# Patient Record
Sex: Male | Born: 1935 | Race: White | Hispanic: No | Marital: Married
Health system: Southern US, Community
[De-identification: ages and names within clinical notes are randomized; demographics above are authoritative.]

---

## 1998-07-30 ENCOUNTER — Ambulatory Visit (HOSPITAL_COMMUNITY): Admission: RE | Admit: 1998-07-30 | Discharge: 1998-07-30 | Payer: Self-pay | Admitting: Urology

## 2000-01-06 ENCOUNTER — Ambulatory Visit (HOSPITAL_COMMUNITY): Admission: RE | Admit: 2000-01-06 | Discharge: 2000-01-06 | Payer: Self-pay | Admitting: Urology

## 2000-01-06 ENCOUNTER — Encounter (INDEPENDENT_AMBULATORY_CARE_PROVIDER_SITE_OTHER): Payer: Self-pay

## 2000-08-03 ENCOUNTER — Ambulatory Visit (HOSPITAL_COMMUNITY): Admission: RE | Admit: 2000-08-03 | Discharge: 2000-08-03 | Payer: Self-pay | Admitting: Urology

## 2000-12-11 ENCOUNTER — Encounter: Payer: Self-pay | Admitting: Urology

## 2000-12-11 ENCOUNTER — Encounter: Admission: RE | Admit: 2000-12-11 | Discharge: 2000-12-11 | Payer: Self-pay | Admitting: Urology

## 2001-01-11 ENCOUNTER — Ambulatory Visit (HOSPITAL_COMMUNITY): Admission: RE | Admit: 2001-01-11 | Discharge: 2001-01-11 | Payer: Self-pay | Admitting: Urology

## 2001-01-11 ENCOUNTER — Encounter (INDEPENDENT_AMBULATORY_CARE_PROVIDER_SITE_OTHER): Payer: Self-pay

## 2001-03-04 ENCOUNTER — Ambulatory Visit (HOSPITAL_COMMUNITY): Admission: RE | Admit: 2001-03-04 | Discharge: 2001-03-04 | Payer: Self-pay | Admitting: Internal Medicine

## 2002-05-25 ENCOUNTER — Encounter: Payer: Self-pay | Admitting: Urology

## 2002-05-25 ENCOUNTER — Encounter: Admission: RE | Admit: 2002-05-25 | Discharge: 2002-05-25 | Payer: Self-pay | Admitting: Urology

## 2002-09-21 ENCOUNTER — Ambulatory Visit (HOSPITAL_COMMUNITY): Admission: RE | Admit: 2002-09-21 | Discharge: 2002-09-21 | Payer: Self-pay | Admitting: Gastroenterology

## 2002-09-21 ENCOUNTER — Encounter (INDEPENDENT_AMBULATORY_CARE_PROVIDER_SITE_OTHER): Payer: Self-pay

## 2006-05-06 ENCOUNTER — Ambulatory Visit: Payer: Self-pay | Admitting: Pulmonary Disease

## 2006-06-10 ENCOUNTER — Ambulatory Visit: Payer: Self-pay | Admitting: Pulmonary Disease

## 2006-09-15 ENCOUNTER — Ambulatory Visit: Payer: Self-pay | Admitting: Pulmonary Disease

## 2007-11-19 ENCOUNTER — Inpatient Hospital Stay (HOSPITAL_COMMUNITY): Admission: EM | Admit: 2007-11-19 | Discharge: 2007-11-23 | Payer: Self-pay | Admitting: Emergency Medicine

## 2007-11-21 ENCOUNTER — Ambulatory Visit: Payer: Self-pay | Admitting: Infectious Diseases

## 2008-04-14 ENCOUNTER — Ambulatory Visit: Payer: Self-pay | Admitting: Oncology

## 2008-05-03 LAB — CBC WITH DIFFERENTIAL/PLATELET
Basophils Absolute: 0 10*3/uL (ref 0.0–0.1)
EOS%: 0.9 % (ref 0.0–7.0)
Eosinophils Absolute: 0.1 10*3/uL (ref 0.0–0.5)
HGB: 17.8 g/dL — ABNORMAL HIGH (ref 13.0–17.1)
LYMPH%: 23.8 % (ref 14.0–48.0)
MCH: 30.4 pg (ref 28.0–33.4)
MCV: 90.5 fL (ref 81.6–98.0)
MONO%: 9.5 % (ref 0.0–13.0)
NEUT%: 65.5 % (ref 40.0–75.0)
Platelets: 673 10*3/uL — ABNORMAL HIGH (ref 145–400)
RDW: 14.4 % (ref 11.2–14.6)

## 2008-05-09 LAB — COMPREHENSIVE METABOLIC PANEL
Alkaline Phosphatase: 47 U/L (ref 39–117)
BUN: 22 mg/dL (ref 6–23)
CO2: 26 mEq/L (ref 19–32)
Creatinine, Ser: 1.41 mg/dL (ref 0.40–1.50)
Glucose, Bld: 104 mg/dL — ABNORMAL HIGH (ref 70–99)
Total Bilirubin: 1.3 mg/dL — ABNORMAL HIGH (ref 0.3–1.2)

## 2008-05-09 LAB — LACTATE DEHYDROGENASE: LDH: 205 U/L (ref 94–250)

## 2008-05-09 LAB — JAK2 GENOTYPR

## 2008-08-01 ENCOUNTER — Ambulatory Visit: Payer: Self-pay | Admitting: Oncology

## 2008-11-01 ENCOUNTER — Ambulatory Visit: Payer: Self-pay | Admitting: Oncology

## 2008-11-01 LAB — COMPREHENSIVE METABOLIC PANEL
AST: 20 U/L (ref 0–37)
Albumin: 3.7 g/dL (ref 3.5–5.2)
Alkaline Phosphatase: 62 U/L (ref 39–117)
BUN: 18 mg/dL (ref 6–23)
Potassium: 4.7 mEq/L (ref 3.5–5.3)
Sodium: 138 mEq/L (ref 135–145)
Total Bilirubin: 1 mg/dL (ref 0.3–1.2)

## 2008-11-01 LAB — CBC WITH DIFFERENTIAL/PLATELET
Basophils Absolute: 0 10*3/uL (ref 0.0–0.1)
EOS%: 1.3 % (ref 0.0–7.0)
LYMPH%: 23.7 % (ref 14.0–49.0)
MCH: 30.1 pg (ref 27.2–33.4)
MCV: 91.6 fL (ref 79.3–98.0)
MONO%: 11.1 % (ref 0.0–14.0)
RBC: 5.3 10*6/uL (ref 4.20–5.82)
RDW: 15.6 % — ABNORMAL HIGH (ref 11.0–14.6)

## 2008-11-06 ENCOUNTER — Ambulatory Visit (HOSPITAL_COMMUNITY): Admission: RE | Admit: 2008-11-06 | Discharge: 2008-11-06 | Payer: Self-pay | Admitting: Urology

## 2008-11-16 ENCOUNTER — Ambulatory Visit (HOSPITAL_COMMUNITY): Admission: RE | Admit: 2008-11-16 | Discharge: 2008-11-16 | Payer: Self-pay | Admitting: Oncology

## 2008-11-23 LAB — CBC WITH DIFFERENTIAL/PLATELET
EOS%: 0.7 % (ref 0.0–7.0)
Eosinophils Absolute: 0.1 10*3/uL (ref 0.0–0.5)
LYMPH%: 18.3 % (ref 14.0–49.0)
MCH: 30.4 pg (ref 27.2–33.4)
MCHC: 33.4 g/dL (ref 32.0–36.0)
MCV: 91 fL (ref 79.3–98.0)
MONO%: 10 % (ref 0.0–14.0)
NEUT#: 6.4 10*3/uL (ref 1.5–6.5)
Platelets: 785 10*3/uL — ABNORMAL HIGH (ref 140–400)
RBC: 5.43 10*6/uL (ref 4.20–5.82)
RDW: 15.1 % — ABNORMAL HIGH (ref 11.0–14.6)

## 2008-11-23 LAB — COMPREHENSIVE METABOLIC PANEL
AST: 14 U/L (ref 0–37)
Alkaline Phosphatase: 55 U/L (ref 39–117)
Glucose, Bld: 100 mg/dL — ABNORMAL HIGH (ref 70–99)
Potassium: 5.2 mEq/L (ref 3.5–5.3)
Sodium: 138 mEq/L (ref 135–145)
Total Bilirubin: 0.7 mg/dL (ref 0.3–1.2)
Total Protein: 7 g/dL (ref 6.0–8.3)

## 2008-11-28 ENCOUNTER — Ambulatory Visit (HOSPITAL_COMMUNITY): Admission: RE | Admit: 2008-11-28 | Discharge: 2008-11-28 | Payer: Self-pay | Admitting: Oncology

## 2008-12-01 ENCOUNTER — Ambulatory Visit (HOSPITAL_COMMUNITY): Admission: RE | Admit: 2008-12-01 | Discharge: 2008-12-01 | Payer: Self-pay | Admitting: Urology

## 2008-12-01 ENCOUNTER — Encounter (INDEPENDENT_AMBULATORY_CARE_PROVIDER_SITE_OTHER): Payer: Self-pay | Admitting: Urology

## 2008-12-05 ENCOUNTER — Encounter: Payer: Self-pay | Admitting: Oncology

## 2008-12-05 ENCOUNTER — Ambulatory Visit (HOSPITAL_COMMUNITY): Admission: RE | Admit: 2008-12-05 | Discharge: 2008-12-05 | Payer: Self-pay | Admitting: Oncology

## 2008-12-22 ENCOUNTER — Ambulatory Visit: Payer: Self-pay | Admitting: Oncology

## 2008-12-26 LAB — CBC WITH DIFFERENTIAL/PLATELET
BASO%: 0.3 % (ref 0.0–2.0)
LYMPH%: 17.9 % (ref 14.0–49.0)
MCHC: 33.6 g/dL (ref 32.0–36.0)
MONO#: 0.8 10*3/uL (ref 0.1–0.9)
MONO%: 8.3 % (ref 0.0–14.0)
Platelets: 706 10*3/uL — ABNORMAL HIGH (ref 140–400)
RBC: 5.44 10*6/uL (ref 4.20–5.82)
RDW: 15.7 % — ABNORMAL HIGH (ref 11.0–14.6)
WBC: 9.8 10*3/uL (ref 4.0–10.3)

## 2008-12-26 LAB — COMPREHENSIVE METABOLIC PANEL
ALT: 9 U/L (ref 0–53)
Albumin: 4.2 g/dL (ref 3.5–5.2)
Alkaline Phosphatase: 62 U/L (ref 39–117)
CO2: 26 mEq/L (ref 19–32)
Glucose, Bld: 93 mg/dL (ref 70–99)
Potassium: 5.3 mEq/L (ref 3.5–5.3)
Sodium: 139 mEq/L (ref 135–145)
Total Bilirubin: 1.5 mg/dL — ABNORMAL HIGH (ref 0.3–1.2)
Total Protein: 7.4 g/dL (ref 6.0–8.3)

## 2009-01-09 LAB — CBC WITH DIFFERENTIAL/PLATELET
BASO%: 0.3 % (ref 0.0–2.0)
EOS%: 4.7 % (ref 0.0–7.0)
Eosinophils Absolute: 0.5 10*3/uL (ref 0.0–0.5)
LYMPH%: 18.8 % (ref 14.0–49.0)
MCHC: 33.1 g/dL (ref 32.0–36.0)
MCV: 90.6 fL (ref 79.3–98.0)
MONO%: 8.7 % (ref 0.0–14.0)
NEUT#: 7.1 10*3/uL — ABNORMAL HIGH (ref 1.5–6.5)
Platelets: 759 10*3/uL — ABNORMAL HIGH (ref 140–400)
RBC: 5.39 10*6/uL (ref 4.20–5.82)
RDW: 16.3 % — ABNORMAL HIGH (ref 11.0–14.6)

## 2009-01-09 LAB — COMPREHENSIVE METABOLIC PANEL
ALT: 14 U/L (ref 0–53)
AST: 21 U/L (ref 0–37)
Albumin: 3.9 g/dL (ref 3.5–5.2)
Alkaline Phosphatase: 62 U/L (ref 39–117)
Potassium: 4.4 mEq/L (ref 3.5–5.3)
Sodium: 138 mEq/L (ref 135–145)
Total Bilirubin: 1.6 mg/dL — ABNORMAL HIGH (ref 0.3–1.2)
Total Protein: 6.7 g/dL (ref 6.0–8.3)

## 2009-02-01 ENCOUNTER — Ambulatory Visit: Payer: Self-pay | Admitting: Oncology

## 2009-02-06 LAB — CBC WITH DIFFERENTIAL/PLATELET
BASO%: 0.4 % (ref 0.0–2.0)
Eosinophils Absolute: 0.1 10*3/uL (ref 0.0–0.5)
LYMPH%: 19.6 % (ref 14.0–49.0)
MCHC: 33.1 g/dL (ref 32.0–36.0)
MONO#: 1 10*3/uL — ABNORMAL HIGH (ref 0.1–0.9)
NEUT#: 7.2 10*3/uL — ABNORMAL HIGH (ref 1.5–6.5)
RBC: 5.54 10*6/uL (ref 4.20–5.82)
RDW: 17.2 % — ABNORMAL HIGH (ref 11.0–14.6)
WBC: 10.5 10*3/uL — ABNORMAL HIGH (ref 4.0–10.3)
lymph#: 2.1 10*3/uL (ref 0.9–3.3)

## 2009-02-06 LAB — COMPREHENSIVE METABOLIC PANEL
ALT: 23 U/L (ref 0–53)
Albumin: 3.6 g/dL (ref 3.5–5.2)
CO2: 29 mEq/L (ref 19–32)
Chloride: 101 mEq/L (ref 96–112)
Glucose, Bld: 93 mg/dL (ref 70–99)
Potassium: 3.8 mEq/L (ref 3.5–5.3)
Sodium: 137 mEq/L (ref 135–145)
Total Bilirubin: 2.2 mg/dL — ABNORMAL HIGH (ref 0.3–1.2)
Total Protein: 6.9 g/dL (ref 6.0–8.3)

## 2009-02-26 ENCOUNTER — Ambulatory Visit (HOSPITAL_COMMUNITY): Admission: RE | Admit: 2009-02-26 | Discharge: 2009-02-26 | Payer: Self-pay | Admitting: Oncology

## 2009-02-27 ENCOUNTER — Ambulatory Visit: Payer: Self-pay | Admitting: Oncology

## 2009-03-01 LAB — COMPREHENSIVE METABOLIC PANEL
ALT: 20 U/L (ref 0–53)
CO2: 30 mEq/L (ref 19–32)
Calcium: 9.5 mg/dL (ref 8.4–10.5)
Chloride: 104 mEq/L (ref 96–112)
Glucose, Bld: 86 mg/dL (ref 70–99)
Sodium: 140 mEq/L (ref 135–145)
Total Bilirubin: 1.4 mg/dL — ABNORMAL HIGH (ref 0.3–1.2)
Total Protein: 7.2 g/dL (ref 6.0–8.3)

## 2009-03-01 LAB — CBC WITH DIFFERENTIAL/PLATELET
BASO%: 0.4 % (ref 0.0–2.0)
Eosinophils Absolute: 0.2 10*3/uL (ref 0.0–0.5)
HCT: 48.7 % (ref 38.4–49.9)
LYMPH%: 21.5 % (ref 14.0–49.0)
MCHC: 33.1 g/dL (ref 32.0–36.0)
MONO#: 0.9 10*3/uL (ref 0.1–0.9)
NEUT#: 6.3 10*3/uL (ref 1.5–6.5)
NEUT%: 66.6 % (ref 39.0–75.0)
Platelets: 711 10*3/uL — ABNORMAL HIGH (ref 140–400)
RBC: 5.29 10*6/uL (ref 4.20–5.82)
WBC: 9.4 10*3/uL (ref 4.0–10.3)
lymph#: 2 10*3/uL (ref 0.9–3.3)

## 2009-03-07 ENCOUNTER — Ambulatory Visit (HOSPITAL_COMMUNITY): Admission: RE | Admit: 2009-03-07 | Discharge: 2009-03-07 | Payer: Self-pay | Admitting: Oncology

## 2009-03-15 LAB — CBC WITH DIFFERENTIAL/PLATELET
BASO%: 0.4 % (ref 0.0–2.0)
HCT: 47 % (ref 38.4–49.9)
MCHC: 33.8 g/dL (ref 32.0–36.0)
MONO#: 1.2 10*3/uL — ABNORMAL HIGH (ref 0.1–0.9)
RBC: 5.27 10*6/uL (ref 4.20–5.82)
RDW: 16 % — ABNORMAL HIGH (ref 11.0–14.6)
WBC: 10.4 10*3/uL — ABNORMAL HIGH (ref 4.0–10.3)
lymph#: 2.3 10*3/uL (ref 0.9–3.3)
nRBC: 0 % (ref 0–0)

## 2009-03-15 LAB — COMPREHENSIVE METABOLIC PANEL
ALT: 16 U/L (ref 0–53)
AST: 27 U/L (ref 0–37)
Albumin: 3.3 g/dL — ABNORMAL LOW (ref 3.5–5.2)
Calcium: 8.9 mg/dL (ref 8.4–10.5)
Chloride: 106 mEq/L (ref 96–112)
Potassium: 4.2 mEq/L (ref 3.5–5.3)
Sodium: 138 mEq/L (ref 135–145)

## 2009-03-22 LAB — CBC WITH DIFFERENTIAL/PLATELET
BASO%: 0.4 % (ref 0.0–2.0)
Basophils Absolute: 0 10*3/uL (ref 0.0–0.1)
HCT: 44.6 % (ref 38.4–49.9)
HGB: 15.3 g/dL (ref 13.0–17.1)
LYMPH%: 18.3 % (ref 14.0–49.0)
MCH: 30.6 pg (ref 27.2–33.4)
MCHC: 34.3 g/dL (ref 32.0–36.0)
MONO#: 1.2 10*3/uL — ABNORMAL HIGH (ref 0.1–0.9)
NEUT%: 68 % (ref 39.0–75.0)
Platelets: 614 10*3/uL — ABNORMAL HIGH (ref 140–400)
WBC: 10 10*3/uL (ref 4.0–10.3)

## 2009-03-22 LAB — COMPREHENSIVE METABOLIC PANEL
ALT: 17 U/L (ref 0–53)
AST: 24 U/L (ref 0–37)
BUN: 24 mg/dL — ABNORMAL HIGH (ref 6–23)
Creatinine, Ser: 1.13 mg/dL (ref 0.40–1.50)
Total Bilirubin: 0.9 mg/dL (ref 0.3–1.2)

## 2009-03-29 ENCOUNTER — Ambulatory Visit: Payer: Self-pay | Admitting: Oncology

## 2009-03-29 LAB — COMPREHENSIVE METABOLIC PANEL
BUN: 26 mg/dL — ABNORMAL HIGH (ref 6–23)
CO2: 27 mEq/L (ref 19–32)
Calcium: 8.9 mg/dL (ref 8.4–10.5)
Chloride: 104 mEq/L (ref 96–112)
Creatinine, Ser: 1.37 mg/dL (ref 0.40–1.50)

## 2009-03-29 LAB — CBC WITH DIFFERENTIAL/PLATELET
Basophils Absolute: 0 10*3/uL (ref 0.0–0.1)
HCT: 43 % (ref 38.4–49.9)
HGB: 14.4 g/dL (ref 13.0–17.1)
MONO#: 1.1 10*3/uL — ABNORMAL HIGH (ref 0.1–0.9)
NEUT%: 68.3 % (ref 39.0–75.0)
WBC: 9.7 10*3/uL (ref 4.0–10.3)
lymph#: 1.9 10*3/uL (ref 0.9–3.3)

## 2009-04-05 LAB — COMPREHENSIVE METABOLIC PANEL
ALT: 15 U/L (ref 0–53)
BUN: 24 mg/dL — ABNORMAL HIGH (ref 6–23)
CO2: 25 mEq/L (ref 19–32)
Calcium: 8.9 mg/dL (ref 8.4–10.5)
Chloride: 105 mEq/L (ref 96–112)
Creatinine, Ser: 1.41 mg/dL (ref 0.40–1.50)

## 2009-04-05 LAB — CBC WITH DIFFERENTIAL/PLATELET
BASO%: 0.3 % (ref 0.0–2.0)
HCT: 40.9 % (ref 38.4–49.9)
HGB: 13.8 g/dL (ref 13.0–17.1)
MCHC: 33.7 g/dL (ref 32.0–36.0)
MONO#: 1.3 10*3/uL — ABNORMAL HIGH (ref 0.1–0.9)
NEUT%: 62.3 % (ref 39.0–75.0)
WBC: 9.4 10*3/uL (ref 4.0–10.3)
lymph#: 2.2 10*3/uL (ref 0.9–3.3)

## 2009-04-12 LAB — COMPREHENSIVE METABOLIC PANEL
ALT: 13 U/L (ref 0–53)
Albumin: 3.4 g/dL — ABNORMAL LOW (ref 3.5–5.2)
CO2: 24 mEq/L (ref 19–32)
Chloride: 105 mEq/L (ref 96–112)
Glucose, Bld: 101 mg/dL — ABNORMAL HIGH (ref 70–99)
Potassium: 4.1 mEq/L (ref 3.5–5.3)
Sodium: 137 mEq/L (ref 135–145)
Total Protein: 6.4 g/dL (ref 6.0–8.3)

## 2009-04-12 LAB — CBC WITH DIFFERENTIAL/PLATELET
Basophils Absolute: 0 10*3/uL (ref 0.0–0.1)
Eosinophils Absolute: 0.1 10*3/uL (ref 0.0–0.5)
HGB: 13.3 g/dL (ref 13.0–17.1)
MCV: 91.1 fL (ref 79.3–98.0)
MONO#: 1 10*3/uL — ABNORMAL HIGH (ref 0.1–0.9)
NEUT#: 4.7 10*3/uL (ref 1.5–6.5)
RDW: 16.3 % — ABNORMAL HIGH (ref 11.0–14.6)
WBC: 7.8 10*3/uL (ref 4.0–10.3)
lymph#: 2.1 10*3/uL (ref 0.9–3.3)

## 2009-04-19 LAB — CBC WITH DIFFERENTIAL/PLATELET
BASO%: 0.2 % (ref 0.0–2.0)
EOS%: 0.3 % (ref 0.0–7.0)
HCT: 36.3 % — ABNORMAL LOW (ref 38.4–49.9)
LYMPH%: 27 % (ref 14.0–49.0)
MCH: 32 pg (ref 27.2–33.4)
MCHC: 34.1 g/dL (ref 32.0–36.0)
MCV: 93.8 fL (ref 79.3–98.0)
MONO#: 0.8 10*3/uL (ref 0.1–0.9)
MONO%: 12 % (ref 0.0–14.0)
NEUT%: 60.5 % (ref 39.0–75.0)
Platelets: 308 10*3/uL (ref 140–400)
RBC: 3.87 10*6/uL — ABNORMAL LOW (ref 4.20–5.82)

## 2009-04-19 LAB — PROTEIN, URINE, 24 HOUR
Collection Interval-UPROT: 24 hours
Protein, 24H Urine: 830 mg/d — ABNORMAL HIGH (ref 50–100)
Urine Total Volume-UPROT: 1050 mL

## 2009-04-19 LAB — COMPREHENSIVE METABOLIC PANEL
Albumin: 3.7 g/dL (ref 3.5–5.2)
Alkaline Phosphatase: 44 U/L (ref 39–117)
BUN: 22 mg/dL (ref 6–23)
CO2: 27 mEq/L (ref 19–32)
Glucose, Bld: 86 mg/dL (ref 70–99)
Total Bilirubin: 1.4 mg/dL — ABNORMAL HIGH (ref 0.3–1.2)

## 2009-04-26 LAB — CBC WITH DIFFERENTIAL/PLATELET
BASO%: 0.2 % (ref 0.0–2.0)
Eosinophils Absolute: 0 10*3/uL (ref 0.0–0.5)
MCHC: 33.8 g/dL (ref 32.0–36.0)
MCV: 92.5 fL (ref 79.3–98.0)
MONO#: 1.1 10*3/uL — ABNORMAL HIGH (ref 0.1–0.9)
MONO%: 13.3 % (ref 0.0–14.0)
NEUT#: 4.5 10*3/uL (ref 1.5–6.5)
NEUT%: 52.8 % (ref 39.0–75.0)
RBC: 4 10*6/uL — ABNORMAL LOW (ref 4.20–5.82)
RDW: 16.9 % — ABNORMAL HIGH (ref 11.0–14.6)
WBC: 8.5 10*3/uL (ref 4.0–10.3)
nRBC: 0 % (ref 0–0)

## 2009-05-01 ENCOUNTER — Ambulatory Visit: Payer: Self-pay | Admitting: Oncology

## 2009-05-03 LAB — COMPREHENSIVE METABOLIC PANEL
ALT: 13 U/L (ref 0–53)
CO2: 23 mEq/L (ref 19–32)
Calcium: 8.9 mg/dL (ref 8.4–10.5)
Chloride: 104 mEq/L (ref 96–112)
Creatinine, Ser: 1.17 mg/dL (ref 0.40–1.50)

## 2009-05-03 LAB — CBC WITH DIFFERENTIAL/PLATELET
Basophils Absolute: 0 10*3/uL (ref 0.0–0.1)
Eosinophils Absolute: 0 10*3/uL (ref 0.0–0.5)
HCT: 35.8 % — ABNORMAL LOW (ref 38.4–49.9)
LYMPH%: 37.2 % (ref 14.0–49.0)
MCV: 94.7 fL (ref 79.3–98.0)
MONO%: 11.3 % (ref 0.0–14.0)
Platelets: 190 10*3/uL (ref 140–400)
WBC: 6.5 10*3/uL (ref 4.0–10.3)

## 2009-05-22 ENCOUNTER — Ambulatory Visit (HOSPITAL_COMMUNITY): Admission: RE | Admit: 2009-05-22 | Discharge: 2009-05-22 | Payer: Self-pay | Admitting: Oncology

## 2009-05-25 LAB — CBC WITH DIFFERENTIAL/PLATELET
BASO%: 0.2 % (ref 0.0–2.0)
Basophils Absolute: 0 10*3/uL (ref 0.0–0.1)
HCT: 33.3 % — ABNORMAL LOW (ref 38.4–49.9)
HGB: 11.3 g/dL — ABNORMAL LOW (ref 13.0–17.1)
MONO#: 0.5 10*3/uL (ref 0.1–0.9)
NEUT#: 3.1 10*3/uL (ref 1.5–6.5)
NEUT%: 51.1 % (ref 39.0–75.0)
WBC: 6.1 10*3/uL (ref 4.0–10.3)
lymph#: 2.4 10*3/uL (ref 0.9–3.3)

## 2009-05-25 LAB — COMPREHENSIVE METABOLIC PANEL
ALT: 8 U/L (ref 0–53)
Albumin: 4.2 g/dL (ref 3.5–5.2)
BUN: 23 mg/dL (ref 6–23)
CO2: 21 mEq/L (ref 19–32)
Calcium: 8.6 mg/dL (ref 8.4–10.5)
Chloride: 107 mEq/L (ref 96–112)
Creatinine, Ser: 1.18 mg/dL (ref 0.40–1.50)
Potassium: 4.4 mEq/L (ref 3.5–5.3)

## 2009-05-28 ENCOUNTER — Ambulatory Visit: Payer: Self-pay | Admitting: Oncology

## 2009-05-30 LAB — COMPREHENSIVE METABOLIC PANEL
ALT: 8 U/L (ref 0–53)
AST: 13 U/L (ref 0–37)
Albumin: 4 g/dL (ref 3.5–5.2)
CO2: 21 mEq/L (ref 19–32)
Calcium: 8.2 mg/dL — ABNORMAL LOW (ref 8.4–10.5)
Chloride: 107 mEq/L (ref 96–112)
Creatinine, Ser: 1.32 mg/dL (ref 0.40–1.50)
Potassium: 4.4 mEq/L (ref 3.5–5.3)
Sodium: 140 mEq/L (ref 135–145)
Total Protein: 6 g/dL (ref 6.0–8.3)

## 2009-05-30 LAB — CBC WITH DIFFERENTIAL/PLATELET
BASO%: 0 % (ref 0.0–2.0)
EOS%: 0.2 % (ref 0.0–7.0)
HCT: 33.2 % — ABNORMAL LOW (ref 38.4–49.9)
MCHC: 33.4 g/dL (ref 32.0–36.0)
MONO#: 0.6 10*3/uL (ref 0.1–0.9)
NEUT%: 34.6 % — ABNORMAL LOW (ref 39.0–75.0)
RDW: 19.2 % — ABNORMAL HIGH (ref 11.0–14.6)
WBC: 4.5 10*3/uL (ref 4.0–10.3)
lymph#: 2.3 10*3/uL (ref 0.9–3.3)

## 2009-06-05 LAB — COMPREHENSIVE METABOLIC PANEL
ALT: 14 U/L (ref 0–53)
AST: 21 U/L (ref 0–37)
CO2: 27 mEq/L (ref 19–32)
Calcium: 8.9 mg/dL (ref 8.4–10.5)
Chloride: 103 mEq/L (ref 96–112)
Creatinine, Ser: 1.27 mg/dL (ref 0.40–1.50)
Sodium: 135 mEq/L (ref 135–145)
Total Protein: 6.4 g/dL (ref 6.0–8.3)

## 2009-06-05 LAB — CBC WITH DIFFERENTIAL/PLATELET
BASO%: 0.3 % (ref 0.0–2.0)
EOS%: 0.3 % (ref 0.0–7.0)
MCH: 35.9 pg — ABNORMAL HIGH (ref 27.2–33.4)
MCHC: 34.4 g/dL (ref 32.0–36.0)
MONO#: 0.4 10*3/uL (ref 0.1–0.9)
NEUT%: 38.2 % — ABNORMAL LOW (ref 39.0–75.0)
RBC: 2.89 10*6/uL — ABNORMAL LOW (ref 4.20–5.82)
RDW: 20.5 % — ABNORMAL HIGH (ref 11.0–14.6)
WBC: 4.6 10*3/uL (ref 4.0–10.3)
lymph#: 2.4 10*3/uL (ref 0.9–3.3)

## 2009-06-12 LAB — CBC WITH DIFFERENTIAL/PLATELET
BASO%: 0.3 % (ref 0.0–2.0)
EOS%: 0.1 % (ref 0.0–7.0)
HCT: 30.4 % — ABNORMAL LOW (ref 38.4–49.9)
LYMPH%: 50.7 % — ABNORMAL HIGH (ref 14.0–49.0)
MCH: 35.7 pg — ABNORMAL HIGH (ref 27.2–33.4)
MCHC: 34.2 g/dL (ref 32.0–36.0)
NEUT%: 34.2 % — ABNORMAL LOW (ref 39.0–75.0)
Platelets: 307 10*3/uL (ref 140–400)
RBC: 2.91 10*6/uL — ABNORMAL LOW (ref 4.20–5.82)
WBC: 4.4 10*3/uL (ref 4.0–10.3)

## 2009-06-12 LAB — COMPREHENSIVE METABOLIC PANEL
ALT: 12 U/L (ref 0–53)
AST: 16 U/L (ref 0–37)
Alkaline Phosphatase: 39 U/L (ref 39–117)
Creatinine, Ser: 1.24 mg/dL (ref 0.40–1.50)
Sodium: 138 mEq/L (ref 135–145)
Total Bilirubin: 1 mg/dL (ref 0.3–1.2)
Total Protein: 6.3 g/dL (ref 6.0–8.3)

## 2009-06-19 LAB — CBC WITH DIFFERENTIAL/PLATELET
BASO%: 0.2 % (ref 0.0–2.0)
EOS%: 0 % (ref 0.0–7.0)
HGB: 10.9 g/dL — ABNORMAL LOW (ref 13.0–17.1)
LYMPH%: 44 % (ref 14.0–49.0)
MCV: 105.4 fL — ABNORMAL HIGH (ref 79.3–98.0)
MONO#: 1.1 10*3/uL — ABNORMAL HIGH (ref 0.1–0.9)
NEUT#: 2.5 10*3/uL (ref 1.5–6.5)
NEUT%: 38.9 % — ABNORMAL LOW (ref 39.0–75.0)
RDW: 19.1 % — ABNORMAL HIGH (ref 11.0–14.6)
WBC: 6.3 10*3/uL (ref 4.0–10.3)

## 2009-06-19 LAB — COMPREHENSIVE METABOLIC PANEL
ALT: 10 U/L (ref 0–53)
AST: 16 U/L (ref 0–37)
Alkaline Phosphatase: 39 U/L (ref 39–117)
Creatinine, Ser: 1.36 mg/dL (ref 0.40–1.50)
Total Bilirubin: 0.9 mg/dL (ref 0.3–1.2)

## 2009-06-25 ENCOUNTER — Ambulatory Visit: Payer: Self-pay | Admitting: Oncology

## 2009-06-26 LAB — CBC WITH DIFFERENTIAL/PLATELET
BASO%: 0.3 % (ref 0.0–2.0)
EOS%: 0.1 % (ref 0.0–7.0)
HCT: 29.6 % — ABNORMAL LOW (ref 38.4–49.9)
LYMPH%: 52.8 % — ABNORMAL HIGH (ref 14.0–49.0)
MCH: 37.3 pg — ABNORMAL HIGH (ref 27.2–33.4)
MCHC: 34.6 g/dL (ref 32.0–36.0)
MCV: 107.7 fL — ABNORMAL HIGH (ref 79.3–98.0)
MONO#: 0.7 10*3/uL (ref 0.1–0.9)
MONO%: 14 % (ref 0.0–14.0)
NEUT%: 32.8 % — ABNORMAL LOW (ref 39.0–75.0)
Platelets: 207 10*3/uL (ref 140–400)

## 2009-06-26 LAB — COMPREHENSIVE METABOLIC PANEL
ALT: 9 U/L (ref 0–53)
CO2: 22 mEq/L (ref 19–32)
Creatinine, Ser: 1.29 mg/dL (ref 0.40–1.50)
Total Bilirubin: 1 mg/dL (ref 0.3–1.2)

## 2009-07-12 LAB — CBC WITH DIFFERENTIAL/PLATELET
BASO%: 0.6 % (ref 0.0–2.0)
Eosinophils Absolute: 0 10*3/uL (ref 0.0–0.5)
HCT: 32.9 % — ABNORMAL LOW (ref 38.4–49.9)
LYMPH%: 47.4 % (ref 14.0–49.0)
MCHC: 33.4 g/dL (ref 32.0–36.0)
MONO#: 0.6 10*3/uL (ref 0.1–0.9)
NEUT%: 40.2 % (ref 39.0–75.0)
Platelets: 133 10*3/uL — ABNORMAL LOW (ref 140–400)
WBC: 5.3 10*3/uL (ref 4.0–10.3)

## 2009-07-12 LAB — COMPREHENSIVE METABOLIC PANEL
BUN: 16 mg/dL (ref 6–23)
CO2: 21 mEq/L (ref 19–32)
Calcium: 8.6 mg/dL (ref 8.4–10.5)
Chloride: 105 mEq/L (ref 96–112)
Creatinine, Ser: 1.09 mg/dL (ref 0.40–1.50)
Glucose, Bld: 92 mg/dL (ref 70–99)

## 2009-07-18 LAB — CBC WITH DIFFERENTIAL/PLATELET
BASO%: 0.6 % (ref 0.0–2.0)
EOS%: 0.3 % (ref 0.0–7.0)
Eosinophils Absolute: 0 10*3/uL (ref 0.0–0.5)
LYMPH%: 60.2 % — ABNORMAL HIGH (ref 14.0–49.0)
MCH: 36.3 pg — ABNORMAL HIGH (ref 27.2–33.4)
MCHC: 33.1 g/dL (ref 32.0–36.0)
MCV: 109.6 fL — ABNORMAL HIGH (ref 79.3–98.0)
MONO%: 12.6 % (ref 0.0–14.0)
NEUT#: 0.9 10*3/uL — ABNORMAL LOW (ref 1.5–6.5)
Platelets: 98 10*3/uL — ABNORMAL LOW (ref 140–400)
RBC: 3.03 10*6/uL — ABNORMAL LOW (ref 4.20–5.82)
RDW: 15.7 % — ABNORMAL HIGH (ref 11.0–14.6)

## 2009-07-23 ENCOUNTER — Ambulatory Visit (HOSPITAL_COMMUNITY): Admission: RE | Admit: 2009-07-23 | Discharge: 2009-07-23 | Payer: Self-pay | Admitting: Oncology

## 2009-07-25 ENCOUNTER — Ambulatory Visit: Payer: Self-pay | Admitting: Oncology

## 2009-07-25 LAB — COMPREHENSIVE METABOLIC PANEL
AST: 22 U/L (ref 0–37)
Albumin: 3.7 g/dL (ref 3.5–5.2)
Alkaline Phosphatase: 38 U/L — ABNORMAL LOW (ref 39–117)
Glucose, Bld: 67 mg/dL — ABNORMAL LOW (ref 70–99)
Potassium: 4.7 mEq/L (ref 3.5–5.3)
Sodium: 138 mEq/L (ref 135–145)
Total Bilirubin: 0.6 mg/dL (ref 0.3–1.2)
Total Protein: 5.7 g/dL — ABNORMAL LOW (ref 6.0–8.3)

## 2009-07-25 LAB — CBC WITH DIFFERENTIAL/PLATELET
Basophils Absolute: 0 10*3/uL (ref 0.0–0.1)
EOS%: 0.3 % (ref 0.0–7.0)
Eosinophils Absolute: 0 10*3/uL (ref 0.0–0.5)
HGB: 9.6 g/dL — ABNORMAL LOW (ref 13.0–17.1)
LYMPH%: 56 % — ABNORMAL HIGH (ref 14.0–49.0)
MCH: 36.1 pg — ABNORMAL HIGH (ref 27.2–33.4)
MCV: 111.7 fL — ABNORMAL HIGH (ref 79.3–98.0)
MONO%: 10.1 % (ref 0.0–14.0)
NEUT#: 1.3 10*3/uL — ABNORMAL LOW (ref 1.5–6.5)
Platelets: 113 10*3/uL — ABNORMAL LOW (ref 140–400)
RBC: 2.66 10*6/uL — ABNORMAL LOW (ref 4.20–5.82)

## 2009-07-31 LAB — CBC WITH DIFFERENTIAL/PLATELET
BASO%: 0.4 % (ref 0.0–2.0)
EOS%: 0.1 % (ref 0.0–7.0)
LYMPH%: 53.9 % — ABNORMAL HIGH (ref 14.0–49.0)
MCHC: 34.1 g/dL (ref 32.0–36.0)
MCV: 112.2 fL — ABNORMAL HIGH (ref 79.3–98.0)
MONO%: 12 % (ref 0.0–14.0)
Platelets: 215 10*3/uL (ref 140–400)
RBC: 2.7 10*6/uL — ABNORMAL LOW (ref 4.20–5.82)
RDW: 16.9 % — ABNORMAL HIGH (ref 11.0–14.6)

## 2009-07-31 LAB — COMPREHENSIVE METABOLIC PANEL
ALT: 11 U/L (ref 0–53)
AST: 18 U/L (ref 0–37)
Alkaline Phosphatase: 36 U/L — ABNORMAL LOW (ref 39–117)
Potassium: 4.6 mEq/L (ref 3.5–5.3)
Sodium: 136 mEq/L (ref 135–145)
Total Bilirubin: 0.9 mg/dL (ref 0.3–1.2)
Total Protein: 6.5 g/dL (ref 6.0–8.3)

## 2009-08-08 LAB — COMPREHENSIVE METABOLIC PANEL
Alkaline Phosphatase: 37 U/L — ABNORMAL LOW (ref 39–117)
BUN: 17 mg/dL (ref 6–23)
Glucose, Bld: 78 mg/dL (ref 70–99)
Sodium: 138 mEq/L (ref 135–145)
Total Bilirubin: 1 mg/dL (ref 0.3–1.2)
Total Protein: 6.3 g/dL (ref 6.0–8.3)

## 2009-08-08 LAB — CBC WITH DIFFERENTIAL/PLATELET
EOS%: 0.2 % (ref 0.0–7.0)
Eosinophils Absolute: 0 10*3/uL (ref 0.0–0.5)
LYMPH%: 48.5 % (ref 14.0–49.0)
MCH: 38.1 pg — ABNORMAL HIGH (ref 27.2–33.4)
MCHC: 34.2 g/dL (ref 32.0–36.0)
MCV: 111.6 fL — ABNORMAL HIGH (ref 79.3–98.0)
MONO%: 16.1 % — ABNORMAL HIGH (ref 0.0–14.0)
NEUT#: 1.4 10*3/uL — ABNORMAL LOW (ref 1.5–6.5)
Platelets: 224 10*3/uL (ref 140–400)
RBC: 2.66 10*6/uL — ABNORMAL LOW (ref 4.20–5.82)

## 2009-08-15 LAB — COMPREHENSIVE METABOLIC PANEL
ALT: 16 U/L (ref 0–53)
AST: 24 U/L (ref 0–37)
Albumin: 3.9 g/dL (ref 3.5–5.2)
Alkaline Phosphatase: 40 U/L (ref 39–117)
Calcium: 8.9 mg/dL (ref 8.4–10.5)
Chloride: 105 mEq/L (ref 96–112)
Potassium: 4 mEq/L (ref 3.5–5.3)
Sodium: 136 mEq/L (ref 135–145)
Total Protein: 6.4 g/dL (ref 6.0–8.3)

## 2009-08-15 LAB — CBC WITH DIFFERENTIAL/PLATELET
BASO%: 0.2 % (ref 0.0–2.0)
Basophils Absolute: 0 10*3/uL (ref 0.0–0.1)
EOS%: 0 % (ref 0.0–7.0)
MCH: 36.6 pg — ABNORMAL HIGH (ref 27.2–33.4)
MCHC: 33.1 g/dL (ref 32.0–36.0)
MCV: 110.4 fL — ABNORMAL HIGH (ref 79.3–98.0)
MONO%: 18.2 % — ABNORMAL HIGH (ref 0.0–14.0)
RBC: 2.79 10*6/uL — ABNORMAL LOW (ref 4.20–5.82)
RDW: 15.6 % — ABNORMAL HIGH (ref 11.0–14.6)
lymph#: 2.1 10*3/uL (ref 0.9–3.3)

## 2009-08-22 LAB — CBC WITH DIFFERENTIAL/PLATELET
BASO%: 0.2 % (ref 0.0–2.0)
Basophils Absolute: 0 10*3/uL (ref 0.0–0.1)
EOS%: 0.1 % (ref 0.0–7.0)
HGB: 10.9 g/dL — ABNORMAL LOW (ref 13.0–17.1)
MCH: 38.7 pg — ABNORMAL HIGH (ref 27.2–33.4)
MCHC: 34.8 g/dL (ref 32.0–36.0)
MCV: 111 fL — ABNORMAL HIGH (ref 79.3–98.0)
MONO%: 16.9 % — ABNORMAL HIGH (ref 0.0–14.0)
RBC: 2.82 10*6/uL — ABNORMAL LOW (ref 4.20–5.82)
RDW: 15.5 % — ABNORMAL HIGH (ref 11.0–14.6)
lymph#: 2.4 10*3/uL (ref 0.9–3.3)

## 2009-08-22 LAB — COMPREHENSIVE METABOLIC PANEL
ALT: 11 U/L (ref 0–53)
AST: 19 U/L (ref 0–37)
Albumin: 4.1 g/dL (ref 3.5–5.2)
Alkaline Phosphatase: 44 U/L (ref 39–117)
BUN: 24 mg/dL — ABNORMAL HIGH (ref 6–23)
Chloride: 105 mEq/L (ref 96–112)
Potassium: 4.1 mEq/L (ref 3.5–5.3)
Sodium: 141 mEq/L (ref 135–145)

## 2009-08-27 ENCOUNTER — Ambulatory Visit: Payer: Self-pay | Admitting: Oncology

## 2009-08-29 LAB — CBC WITH DIFFERENTIAL/PLATELET
Basophils Absolute: 0 10*3/uL (ref 0.0–0.1)
Eosinophils Absolute: 0 10*3/uL (ref 0.0–0.5)
HGB: 11 g/dL — ABNORMAL LOW (ref 13.0–17.1)
MCV: 109.9 fL — ABNORMAL HIGH (ref 79.3–98.0)
MONO%: 15.8 % — ABNORMAL HIGH (ref 0.0–14.0)
NEUT#: 2.7 10*3/uL (ref 1.5–6.5)
RBC: 3.02 10*6/uL — ABNORMAL LOW (ref 4.20–5.82)
RDW: 15.6 % — ABNORMAL HIGH (ref 11.0–14.6)
WBC: 6.4 10*3/uL (ref 4.0–10.3)
lymph#: 2.7 10*3/uL (ref 0.9–3.3)
nRBC: 0 % (ref 0–0)

## 2009-08-29 LAB — COMPREHENSIVE METABOLIC PANEL
AST: 18 U/L (ref 0–37)
BUN: 24 mg/dL — ABNORMAL HIGH (ref 6–23)
Calcium: 8.9 mg/dL (ref 8.4–10.5)
Chloride: 105 mEq/L (ref 96–112)
Creatinine, Ser: 1.08 mg/dL (ref 0.40–1.50)

## 2009-09-05 LAB — CBC WITH DIFFERENTIAL/PLATELET
Basophils Absolute: 0 10*3/uL (ref 0.0–0.1)
EOS%: 0.2 % (ref 0.0–7.0)
Eosinophils Absolute: 0 10*3/uL (ref 0.0–0.5)
HCT: 33.2 % — ABNORMAL LOW (ref 38.4–49.9)
HGB: 11.2 g/dL — ABNORMAL LOW (ref 13.0–17.1)
MCH: 36.6 pg — ABNORMAL HIGH (ref 27.2–33.4)
MONO#: 0.6 10*3/uL (ref 0.1–0.9)
NEUT#: 1.8 10*3/uL (ref 1.5–6.5)
NEUT%: 37.4 % — ABNORMAL LOW (ref 39.0–75.0)
lymph#: 2.4 10*3/uL (ref 0.9–3.3)

## 2009-09-05 LAB — COMPREHENSIVE METABOLIC PANEL
Albumin: 3.9 g/dL (ref 3.5–5.2)
BUN: 18 mg/dL (ref 6–23)
CO2: 27 mEq/L (ref 19–32)
Calcium: 8.6 mg/dL (ref 8.4–10.5)
Chloride: 105 mEq/L (ref 96–112)
Glucose, Bld: 102 mg/dL — ABNORMAL HIGH (ref 70–99)
Potassium: 4 mEq/L (ref 3.5–5.3)
Total Protein: 6.4 g/dL (ref 6.0–8.3)

## 2009-09-12 LAB — CBC WITH DIFFERENTIAL/PLATELET
BASO%: 0.6 % (ref 0.0–2.0)
Basophils Absolute: 0 10*3/uL (ref 0.0–0.1)
EOS%: 0.2 % (ref 0.0–7.0)
HCT: 31.9 % — ABNORMAL LOW (ref 38.4–49.9)
LYMPH%: 52.6 % — ABNORMAL HIGH (ref 14.0–49.0)
MCH: 36.8 pg — ABNORMAL HIGH (ref 27.2–33.4)
MCHC: 33.5 g/dL (ref 32.0–36.0)
MCV: 109.6 fL — ABNORMAL HIGH (ref 79.3–98.0)
MONO%: 10.3 % (ref 0.0–14.0)
NEUT%: 36.3 % — ABNORMAL LOW (ref 39.0–75.0)
Platelets: 138 10*3/uL — ABNORMAL LOW (ref 140–400)
lymph#: 2.7 10*3/uL (ref 0.9–3.3)

## 2009-09-12 LAB — COMPREHENSIVE METABOLIC PANEL
Alkaline Phosphatase: 39 U/L (ref 39–117)
BUN: 21 mg/dL (ref 6–23)
CO2: 21 mEq/L (ref 19–32)
Creatinine, Ser: 1.01 mg/dL (ref 0.40–1.50)
Glucose, Bld: 100 mg/dL — ABNORMAL HIGH (ref 70–99)
Total Bilirubin: 0.9 mg/dL (ref 0.3–1.2)

## 2009-09-17 ENCOUNTER — Ambulatory Visit (HOSPITAL_COMMUNITY): Admission: RE | Admit: 2009-09-17 | Discharge: 2009-09-17 | Payer: Self-pay | Admitting: Oncology

## 2009-09-19 LAB — CBC WITH DIFFERENTIAL/PLATELET
Basophils Absolute: 0 10*3/uL (ref 0.0–0.1)
Eosinophils Absolute: 0 10*3/uL (ref 0.0–0.5)
HGB: 10.4 g/dL — ABNORMAL LOW (ref 13.0–17.1)
LYMPH%: 51.7 % — ABNORMAL HIGH (ref 14.0–49.0)
MCV: 111.5 fL — ABNORMAL HIGH (ref 79.3–98.0)
MONO#: 0.5 10*3/uL (ref 0.1–0.9)
MONO%: 11 % (ref 0.0–14.0)
NEUT#: 1.5 10*3/uL (ref 1.5–6.5)
Platelets: 154 10*3/uL (ref 140–400)
RBC: 2.67 10*6/uL — ABNORMAL LOW (ref 4.20–5.82)
WBC: 4.2 10*3/uL (ref 4.0–10.3)

## 2009-09-19 LAB — COMPREHENSIVE METABOLIC PANEL
Albumin: 3.7 g/dL (ref 3.5–5.2)
Alkaline Phosphatase: 43 U/L (ref 39–117)
BUN: 19 mg/dL (ref 6–23)
CO2: 23 mEq/L (ref 19–32)
Glucose, Bld: 101 mg/dL — ABNORMAL HIGH (ref 70–99)
Potassium: 4.6 mEq/L (ref 3.5–5.3)
Sodium: 139 mEq/L (ref 135–145)
Total Protein: 6.1 g/dL (ref 6.0–8.3)

## 2009-10-30 ENCOUNTER — Ambulatory Visit: Payer: Self-pay | Admitting: Oncology

## 2009-11-01 LAB — COMPREHENSIVE METABOLIC PANEL
ALT: 8 U/L (ref 0–53)
AST: 17 U/L (ref 0–37)
Alkaline Phosphatase: 48 U/L (ref 39–117)
Calcium: 8.9 mg/dL (ref 8.4–10.5)
Chloride: 103 mEq/L (ref 96–112)
Creatinine, Ser: 1.12 mg/dL (ref 0.40–1.50)

## 2009-11-01 LAB — CBC WITH DIFFERENTIAL/PLATELET
BASO%: 0.5 % (ref 0.0–2.0)
EOS%: 0.9 % (ref 0.0–7.0)
HCT: 34.9 % — ABNORMAL LOW (ref 38.4–49.9)
MCH: 37.2 pg — ABNORMAL HIGH (ref 27.2–33.4)
MCHC: 33.5 g/dL (ref 32.0–36.0)
MONO%: 13.1 % (ref 0.0–14.0)
NEUT%: 48.2 % (ref 39.0–75.0)
RDW: 14.8 % — ABNORMAL HIGH (ref 11.0–14.6)
lymph#: 1.9 10*3/uL (ref 0.9–3.3)

## 2009-12-13 ENCOUNTER — Ambulatory Visit: Payer: Self-pay | Admitting: Oncology

## 2009-12-14 LAB — CBC WITH DIFFERENTIAL/PLATELET
BASO%: 0.2 % (ref 0.0–2.0)
HCT: 38.3 % — ABNORMAL LOW (ref 38.4–49.9)
MCHC: 34.1 g/dL (ref 32.0–36.0)
MONO#: 0.9 10*3/uL (ref 0.1–0.9)
NEUT%: 62.8 % (ref 39.0–75.0)
RBC: 3.87 10*6/uL — ABNORMAL LOW (ref 4.20–5.82)
WBC: 6.7 10*3/uL (ref 4.0–10.3)
lymph#: 1.5 10*3/uL (ref 0.9–3.3)

## 2009-12-14 LAB — COMPREHENSIVE METABOLIC PANEL
ALT: 11 U/L (ref 0–53)
AST: 20 U/L (ref 0–37)
Albumin: 3.9 g/dL (ref 3.5–5.2)
Calcium: 8.9 mg/dL (ref 8.4–10.5)
Chloride: 104 mEq/L (ref 96–112)
Potassium: 4.5 mEq/L (ref 3.5–5.3)
Sodium: 137 mEq/L (ref 135–145)

## 2009-12-17 ENCOUNTER — Ambulatory Visit (HOSPITAL_COMMUNITY): Admission: RE | Admit: 2009-12-17 | Discharge: 2009-12-17 | Payer: Self-pay | Admitting: Oncology

## 2009-12-19 ENCOUNTER — Ambulatory Visit: Admission: RE | Admit: 2009-12-19 | Discharge: 2010-02-01 | Payer: Self-pay | Admitting: Radiation Oncology

## 2010-01-16 ENCOUNTER — Encounter: Admission: RE | Admit: 2010-01-16 | Discharge: 2010-01-24 | Payer: Self-pay | Admitting: Oncology

## 2010-02-05 ENCOUNTER — Ambulatory Visit: Payer: Self-pay | Admitting: Oncology

## 2010-02-07 LAB — COMPREHENSIVE METABOLIC PANEL
ALT: <8 U/L (ref 0–53)
CO2: 24 mEq/L (ref 19–32)
Calcium: 9.1 mg/dL (ref 8.4–10.5)
Chloride: 102 mEq/L (ref 96–112)
Creatinine, Ser: 1.17 mg/dL (ref 0.40–1.50)

## 2010-02-07 LAB — CBC WITH DIFFERENTIAL/PLATELET
BASO%: 0.4 % (ref 0.0–2.0)
Basophils Absolute: 0 10*3/uL (ref 0.0–0.1)
Eosinophils Absolute: 0 10*3/uL (ref 0.0–0.5)
HCT: 40.3 % (ref 38.4–49.9)
HGB: 13.3 g/dL (ref 13.0–17.1)
MONO#: 0.9 10*3/uL (ref 0.1–0.9)
NEUT#: 4.3 10*3/uL (ref 1.5–6.5)
NEUT%: 64.2 % (ref 39.0–75.0)
WBC: 6.6 10*3/uL (ref 4.0–10.3)
lymph#: 1.4 10*3/uL (ref 0.9–3.3)

## 2010-03-06 ENCOUNTER — Ambulatory Visit: Payer: Self-pay | Admitting: Vascular Surgery

## 2010-03-06 ENCOUNTER — Ambulatory Visit
Admission: RE | Admit: 2010-03-06 | Discharge: 2010-03-06 | Payer: Self-pay | Source: Home / Self Care | Admitting: Oncology

## 2010-03-06 LAB — CBC WITH DIFFERENTIAL/PLATELET
Basophils Absolute: 0 10*3/uL (ref 0.0–0.1)
HCT: 34 % — ABNORMAL LOW (ref 38.4–49.9)
HGB: 11.6 g/dL — ABNORMAL LOW (ref 13.0–17.1)
LYMPH%: 27.9 % (ref 14.0–49.0)
MCH: 30.8 pg (ref 27.2–33.4)
MONO#: 0.7 10*3/uL (ref 0.1–0.9)
NEUT%: 59.2 % (ref 39.0–75.0)
Platelets: 383 10*3/uL (ref 140–400)
WBC: 5.4 10*3/uL (ref 4.0–10.3)
lymph#: 1.5 10*3/uL (ref 0.9–3.3)

## 2010-03-06 LAB — COMPREHENSIVE METABOLIC PANEL
BUN: 20 mg/dL (ref 6–23)
CO2: 26 mEq/L (ref 19–32)
Calcium: 8.6 mg/dL (ref 8.4–10.5)
Chloride: 106 mEq/L (ref 96–112)
Creatinine, Ser: 1.47 mg/dL (ref 0.40–1.50)

## 2010-03-07 ENCOUNTER — Ambulatory Visit: Payer: Self-pay | Admitting: Oncology

## 2010-03-14 ENCOUNTER — Ambulatory Visit: Payer: Self-pay | Admitting: Cardiology

## 2010-03-26 LAB — CBC WITH DIFFERENTIAL/PLATELET
Basophils Absolute: 0 10*3/uL (ref 0.0–0.1)
Eosinophils Absolute: 0 10*3/uL (ref 0.0–0.5)
HGB: 9.7 g/dL — ABNORMAL LOW (ref 13.0–17.1)
MCV: 92.4 fL (ref 79.3–98.0)
MONO#: 0.9 10*3/uL (ref 0.1–0.9)
MONO%: 23.2 % — ABNORMAL HIGH (ref 0.0–14.0)
NEUT#: 1.6 10*3/uL (ref 1.5–6.5)
Platelets: 420 10*3/uL — ABNORMAL HIGH (ref 140–400)
RBC: 3.2 10*6/uL — ABNORMAL LOW (ref 4.20–5.82)
RDW: 19.2 % — ABNORMAL HIGH (ref 11.0–14.6)
WBC: 4 10*3/uL (ref 4.0–10.3)

## 2010-03-26 LAB — IRON AND TIBC
Iron: 72 ug/dL (ref 42–165)
TIBC: 263 ug/dL (ref 215–435)
UIBC: 191 ug/dL

## 2010-03-26 LAB — COMPREHENSIVE METABOLIC PANEL
Albumin: 3.6 g/dL (ref 3.5–5.2)
Alkaline Phosphatase: 50 U/L (ref 39–117)
BUN: 16 mg/dL (ref 6–23)
CO2: 23 mEq/L (ref 19–32)
Calcium: 8.5 mg/dL (ref 8.4–10.5)
Glucose, Bld: 85 mg/dL (ref 70–99)
Potassium: 5 mEq/L (ref 3.5–5.3)
Sodium: 139 mEq/L (ref 135–145)
Total Protein: 6.2 g/dL (ref 6.0–8.3)

## 2010-04-08 ENCOUNTER — Encounter
Admission: RE | Admit: 2010-04-08 | Discharge: 2010-07-04 | Payer: Self-pay | Source: Home / Self Care | Attending: Oncology | Admitting: Oncology

## 2010-04-09 ENCOUNTER — Ambulatory Visit (HOSPITAL_COMMUNITY): Admission: RE | Admit: 2010-04-09 | Discharge: 2010-04-09 | Payer: Self-pay | Admitting: Oncology

## 2010-04-10 ENCOUNTER — Ambulatory Visit: Payer: Self-pay | Admitting: Oncology

## 2010-04-12 LAB — CBC WITH DIFFERENTIAL/PLATELET
BASO%: 0 % (ref 0.0–2.0)
HCT: 21.9 % — ABNORMAL LOW (ref 38.4–49.9)
LYMPH%: 57.9 % — ABNORMAL HIGH (ref 14.0–49.0)
MCH: 30.3 pg (ref 27.2–33.4)
MCHC: 32.9 g/dL (ref 32.0–36.0)
MCV: 92 fL (ref 79.3–98.0)
MONO#: 0.7 10*3/uL (ref 0.1–0.9)
MONO%: 27 % — ABNORMAL HIGH (ref 0.0–14.0)
NEUT%: 14.7 % — ABNORMAL LOW (ref 39.0–75.0)
Platelets: 137 10*3/uL — ABNORMAL LOW (ref 140–400)
RBC: 2.38 10*6/uL — ABNORMAL LOW (ref 4.20–5.82)
nRBC: 0 % (ref 0–0)

## 2010-04-12 LAB — COMPREHENSIVE METABOLIC PANEL
AST: 68 U/L — ABNORMAL HIGH (ref 0–37)
BUN: 20 mg/dL (ref 6–23)
Calcium: 8.4 mg/dL (ref 8.4–10.5)
Chloride: 105 mEq/L (ref 96–112)
Creatinine, Ser: 1.15 mg/dL (ref 0.40–1.50)
Total Bilirubin: 0.7 mg/dL (ref 0.3–1.2)

## 2010-04-16 ENCOUNTER — Encounter (HOSPITAL_COMMUNITY)
Admission: RE | Admit: 2010-04-16 | Discharge: 2010-07-15 | Payer: Self-pay | Source: Home / Self Care | Attending: Oncology | Admitting: Oncology

## 2010-04-16 LAB — CBC WITH DIFFERENTIAL/PLATELET
Eosinophils Absolute: 0 10*3/uL (ref 0.0–0.5)
HCT: 23 % — ABNORMAL LOW (ref 38.4–49.9)
LYMPH%: 40.9 % (ref 14.0–49.0)
MONO#: 1.2 10*3/uL — ABNORMAL HIGH (ref 0.1–0.9)
NEUT#: 1.7 10*3/uL (ref 1.5–6.5)
NEUT%: 33.9 % — ABNORMAL LOW (ref 39.0–75.0)
Platelets: 516 10*3/uL — ABNORMAL HIGH (ref 140–400)
WBC: 4.9 10*3/uL (ref 4.0–10.3)

## 2010-04-24 LAB — COMPREHENSIVE METABOLIC PANEL
AST: 21 U/L (ref 0–37)
Alkaline Phosphatase: 48 U/L (ref 39–117)
BUN: 15 mg/dL (ref 6–23)
Calcium: 8.3 mg/dL — ABNORMAL LOW (ref 8.4–10.5)
Chloride: 108 mEq/L (ref 96–112)
Creatinine, Ser: 1.1 mg/dL (ref 0.40–1.50)

## 2010-04-24 LAB — CBC WITH DIFFERENTIAL/PLATELET
BASO%: 0.1 % (ref 0.0–2.0)
EOS%: 0.4 % (ref 0.0–7.0)
HCT: 30.5 % — ABNORMAL LOW (ref 38.4–49.9)
LYMPH%: 19.6 % (ref 14.0–49.0)
MCH: 31.9 pg (ref 27.2–33.4)
MCHC: 31.1 g/dL — ABNORMAL LOW (ref 32.0–36.0)
NEUT%: 59.7 % (ref 39.0–75.0)
Platelets: 225 10*3/uL (ref 140–400)

## 2010-05-07 LAB — CBC WITH DIFFERENTIAL/PLATELET
Basophils Absolute: 0 10*3/uL (ref 0.0–0.1)
EOS%: 0.5 % (ref 0.0–7.0)
Eosinophils Absolute: 0 10*3/uL (ref 0.0–0.5)
HCT: 20.1 % — ABNORMAL LOW (ref 38.4–49.9)
HGB: 7.1 g/dL — ABNORMAL LOW (ref 13.0–17.1)
MCH: 35.2 pg — ABNORMAL HIGH (ref 27.2–33.4)
NEUT#: 0.5 10*3/uL — ABNORMAL LOW (ref 1.5–6.5)
NEUT%: 24 % — ABNORMAL LOW (ref 39.0–75.0)
lymph#: 1.3 10*3/uL (ref 0.9–3.3)

## 2010-05-13 ENCOUNTER — Ambulatory Visit: Payer: Self-pay | Admitting: Oncology

## 2010-05-15 ENCOUNTER — Encounter: Payer: Self-pay | Admitting: Pulmonary Disease

## 2010-05-15 LAB — CBC WITH DIFFERENTIAL/PLATELET
BASO%: 0.2 % (ref 0.0–2.0)
Basophils Absolute: 0 10*3/uL (ref 0.0–0.1)
EOS%: 0 % (ref 0.0–7.0)
HGB: 9.2 g/dL — ABNORMAL LOW (ref 13.0–17.1)
MCH: 31.9 pg (ref 27.2–33.4)
MCV: 95.5 fL (ref 79.3–98.0)
MONO%: 28.7 % — ABNORMAL HIGH (ref 0.0–14.0)
RBC: 2.88 10*6/uL — ABNORMAL LOW (ref 4.20–5.82)
RDW: 21.1 % — ABNORMAL HIGH (ref 11.0–14.6)
lymph#: 1.7 10*3/uL (ref 0.9–3.3)
nRBC: 0 % (ref 0–0)

## 2010-06-04 LAB — CBC WITH DIFFERENTIAL/PLATELET
BASO%: 0.4 % (ref 0.0–2.0)
Eosinophils Absolute: 0 10*3/uL (ref 0.0–0.5)
LYMPH%: 19.4 % (ref 14.0–49.0)
MCHC: 33.5 g/dL (ref 32.0–36.0)
MONO#: 0.7 10*3/uL (ref 0.1–0.9)
NEUT#: 3.9 10*3/uL (ref 1.5–6.5)
RBC: 2.7 10*6/uL — ABNORMAL LOW (ref 4.20–5.82)
RDW: 28 % — ABNORMAL HIGH (ref 11.0–14.6)
WBC: 5.8 10*3/uL (ref 4.0–10.3)
lymph#: 1.1 10*3/uL (ref 0.9–3.3)

## 2010-06-04 LAB — COMPREHENSIVE METABOLIC PANEL
ALT: 10 U/L (ref 0–53)
Albumin: 3.6 g/dL (ref 3.5–5.2)
CO2: 21 mEq/L (ref 19–32)
Chloride: 105 mEq/L (ref 96–112)
Glucose, Bld: 130 mg/dL — ABNORMAL HIGH (ref 70–99)
Potassium: 4.9 mEq/L (ref 3.5–5.3)
Sodium: 141 mEq/L (ref 135–145)
Total Bilirubin: 0.8 mg/dL (ref 0.3–1.2)
Total Protein: 6.1 g/dL (ref 6.0–8.3)

## 2010-06-21 ENCOUNTER — Ambulatory Visit: Payer: Self-pay | Admitting: Oncology

## 2010-06-25 LAB — COMPREHENSIVE METABOLIC PANEL
AST: 21 U/L (ref 0–37)
Albumin: 3.6 g/dL (ref 3.5–5.2)
Alkaline Phosphatase: 58 U/L (ref 39–117)
BUN: 15 mg/dL (ref 6–23)
Calcium: 8.6 mg/dL (ref 8.4–10.5)
Chloride: 106 mEq/L (ref 96–112)
Glucose, Bld: 89 mg/dL (ref 70–99)
Potassium: 5.2 mEq/L (ref 3.5–5.3)
Sodium: 141 mEq/L (ref 135–145)
Total Protein: 6.5 g/dL (ref 6.0–8.3)

## 2010-06-25 LAB — CBC WITH DIFFERENTIAL/PLATELET
Basophils Absolute: 0 10*3/uL (ref 0.0–0.1)
Eosinophils Absolute: 0 10*3/uL (ref 0.0–0.5)
HGB: 10.9 g/dL — ABNORMAL LOW (ref 13.0–17.1)
NEUT#: 3.4 10*3/uL (ref 1.5–6.5)
RBC: 3.24 10*6/uL — ABNORMAL LOW (ref 4.20–5.82)
RDW: 20.9 % — ABNORMAL HIGH (ref 11.0–14.6)
WBC: 5.9 10*3/uL (ref 4.0–10.3)
lymph#: 1.5 10*3/uL (ref 0.9–3.3)

## 2010-07-04 IMAGING — CT NM PET TUM IMG INITIAL (PI) SKULL BASE T - THIGH
7 series · 25 of 25 positions shown · IV contrast (350 OM)
Comparison: CT 11/16/2008

CLINICAL DATA: Initial treatment strategy for lung cancer.

NUCLEAR MEDICINE PET SKULL BASE TO THIGH
Fasting Blood Glucose:  117
TECHNIQUE: 17.7 mCi F-18 FDG was injected intravenously via the
right antecubital fossa.  Full-ring PET imaging was performed from
the skull base through the mid-thighs 60  minutes after injection.
CT data was obtained and used for attenuation correction and
anatomic localization only.  (This was not acquired as a diagnostic
CT examination.)

[Series 1: pet ac · axial · 3.3mm · 4.69mm/px · z∈[-883,-13]mm · 5 of 267 slices shown]
[im 1/267]
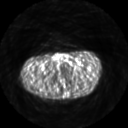
[im 67/267]
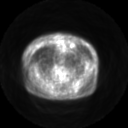
[im 134/267]
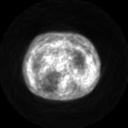
[im 200/267]
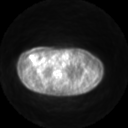
[im 267/267]
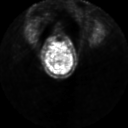

[Series 2: ct images · axial · 3.8mm · 0.98mm/px · z∈[-883,-13]mm · 5 of 267 slices shown]
[im 1/267]
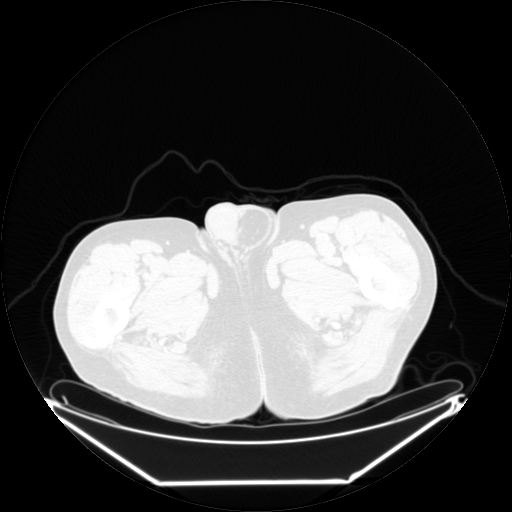
[im 67/267]
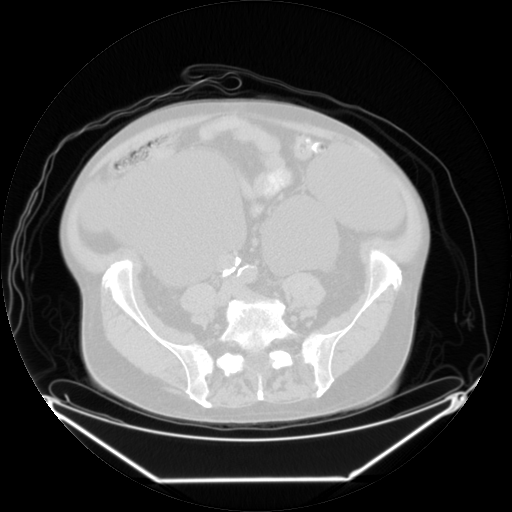
[im 134/267]
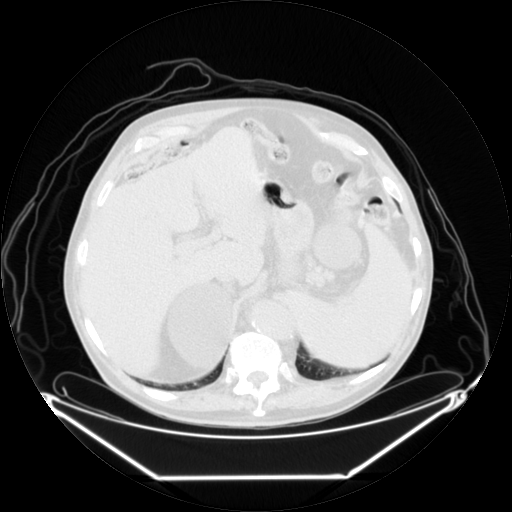
[im 200/267]
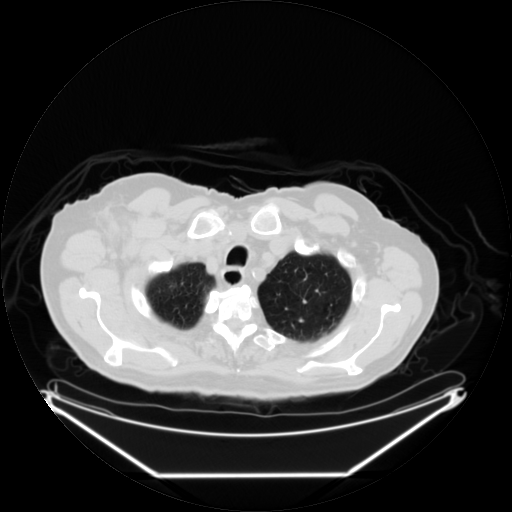
[im 267/267  brain]
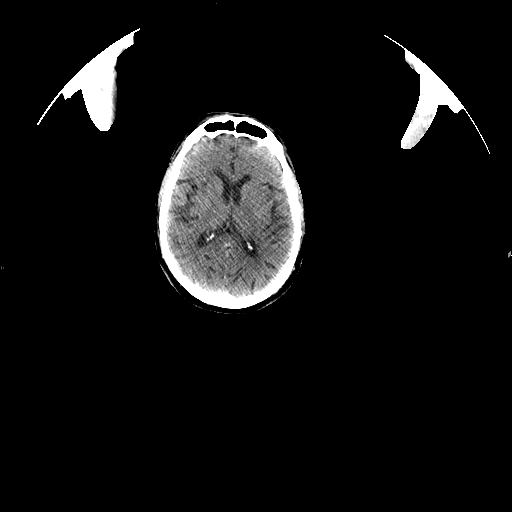

[Series 2: pet nac · axial · 3.3mm · 4.69mm/px · z∈[-883,-13]mm · 5 of 267 slices shown]
[im 1/267]
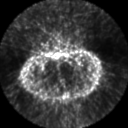
[im 67/267]
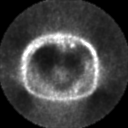
[im 134/267]
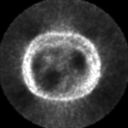
[im 200/267]
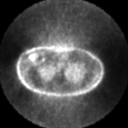
[im 267/267]
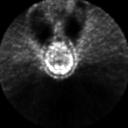

[Series 123: mip · coronal · 3.3mm · 4.69mm/px · 1 of 30 slices shown]
[im 1/30]
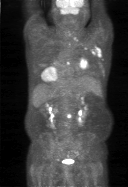

[Series 151: reformatted · axial · 3.3mm · 3.91mm/px · z∈[-883,-13]mm · 6 of 267 slices shown (1 of 3)]
[im 1/267]
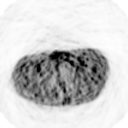
[im 54/267]
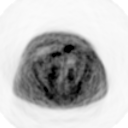
[im 107/267]
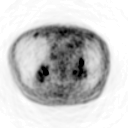
[im 160/267]
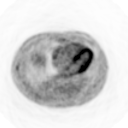
[im 213/267]
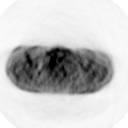
[im 267/267]
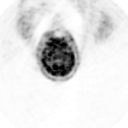

[Series 153: reformatted · coronal · 4.7mm · 6.98mm/px · 2 of 80 slices shown (2 of 3)]
[im 1/80]
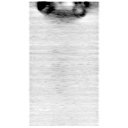
[im 80/80]
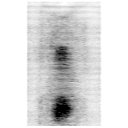

[Series 250: reformatted · axial · 3.8mm · 0.98mm/px · 1 of 3 slices shown (3 of 3)]
[im 1/3]
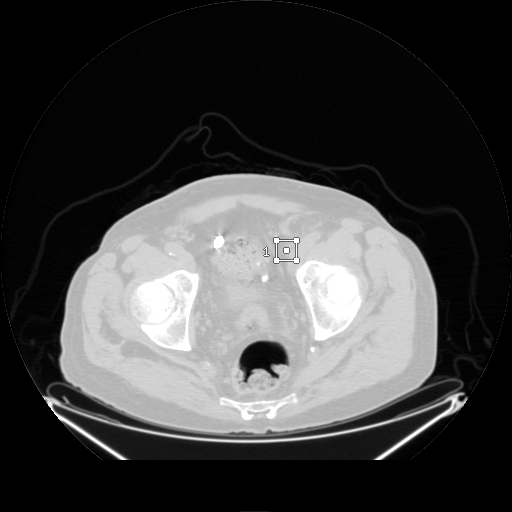

[25 of 25 positions shown; findings below may reference images not displayed]

FINDINGS: Neck: There are hypermetabolic right supraclavicular lymph nodes
posterior lateral to the thyroid cartilage with SUV max = 5.8.

Chest: Within the right axilla there is a cluster of enlarged bulky
lymph nodes with intense metabolic activity ( SUV max = 13.8 ).
Hypermetabolic adenopathy extends in the right sub pectoralis
location and into the right supraclavicular stations.

Within the right lower lobe there is an intensely hypermetabolic
peripheral mass which measures 3.7 cm with SUV max = 15.7.  Within
the mediastinum, there is a single intense hypermetabolic
subcarinal lymph node.

Abdomen / Pelvis: No evidence of abnormal hypermetabolic activity
within the solid organs.  Adrenal glands do not demonstrate
hypermetabolic activity.

Within the pelvis, there is a small 7 mm hypermetabolic left
external iliac node (image 230) with SUV max = 5.7.

Incidental note of massively enlarged bilateral renal cyst with no
associated metabolic activity.

Skeleton: There is intense hypermetabolic activity associate the
lytic lesion within the pedicle of the L2 vertebral body with SUV
max = 13.4.   There is a focus of hypermetabolic in the spinous
process of the T3 vertebral body with a tiny cortical lucency
evident on image 65.
IMPRESSION: 1.  Hypermetabolic right lower lobe mass consistent primary
bronchogenic carcinoma.
2.  Evidence for nodal metastasis including the right axilla, right
supraclavicular nodes, and subcarinal lymph node.
3.  Distant nodal metastasis to a left external iliac node.
4.  Evidence for skeletal metastasis within the L2 vertebral body
and spinous process of T3.

## 2010-07-17 ENCOUNTER — Ambulatory Visit (HOSPITAL_COMMUNITY)
Admission: RE | Admit: 2010-07-17 | Discharge: 2010-07-17 | Payer: Self-pay | Source: Home / Self Care | Attending: Oncology | Admitting: Oncology

## 2010-07-17 LAB — CBC WITH DIFFERENTIAL/PLATELET
BASO%: 0.3 % (ref 0.0–2.0)
Basophils Absolute: 0 10*3/uL (ref 0.0–0.1)
EOS%: 0.2 % (ref 0.0–7.0)
Eosinophils Absolute: 0 10*3/uL (ref 0.0–0.5)
HCT: 35.9 % — ABNORMAL LOW (ref 38.4–49.9)
HGB: 11.2 g/dL — ABNORMAL LOW (ref 13.0–17.1)
LYMPH%: 26.1 % (ref 14.0–49.0)
MCH: 30.7 pg (ref 27.2–33.4)
MCHC: 31.2 g/dL — ABNORMAL LOW (ref 32.0–36.0)
MCV: 98.4 fL — ABNORMAL HIGH (ref 79.3–98.0)
MONO#: 0.9 10*3/uL (ref 0.1–0.9)
MONO%: 14.6 % — ABNORMAL HIGH (ref 0.0–14.0)
NEUT#: 3.6 10*3/uL (ref 1.5–6.5)
NEUT%: 58.8 % (ref 39.0–75.0)
Platelets: 307 10*3/uL (ref 140–400)
RBC: 3.65 10*6/uL — ABNORMAL LOW (ref 4.20–5.82)
RDW: 17.2 % — ABNORMAL HIGH (ref 11.0–14.6)
WBC: 6.2 10*3/uL (ref 4.0–10.3)
lymph#: 1.6 10*3/uL (ref 0.9–3.3)

## 2010-07-23 ENCOUNTER — Ambulatory Visit: Payer: Self-pay | Admitting: Oncology

## 2010-07-24 LAB — CBC WITH DIFFERENTIAL/PLATELET
BASO%: 0.4 % (ref 0.0–2.0)
Basophils Absolute: 0 10*3/uL (ref 0.0–0.1)
EOS%: 0.2 % (ref 0.0–7.0)
Eosinophils Absolute: 0 10*3/uL (ref 0.0–0.5)
HCT: 34 % — ABNORMAL LOW (ref 38.4–49.9)
HGB: 11.1 g/dL — ABNORMAL LOW (ref 13.0–17.1)
LYMPH%: 26.8 % (ref 14.0–49.0)
MCH: 31.1 pg (ref 27.2–33.4)
MCHC: 32.5 g/dL (ref 32.0–36.0)
MCV: 95.7 fL (ref 79.3–98.0)
MONO#: 0.7 10*3/uL (ref 0.1–0.9)
MONO%: 13.9 % (ref 0.0–14.0)
NEUT#: 3 10*3/uL (ref 1.5–6.5)
NEUT%: 58.7 % (ref 39.0–75.0)
Platelets: 363 10*3/uL (ref 140–400)
RBC: 3.55 10*6/uL — ABNORMAL LOW (ref 4.20–5.82)
RDW: 18.9 % — ABNORMAL HIGH (ref 11.0–14.6)
WBC: 5.2 10*3/uL (ref 4.0–10.3)
lymph#: 1.4 10*3/uL (ref 0.9–3.3)

## 2010-07-24 LAB — COMPREHENSIVE METABOLIC PANEL
ALT: <8 U/L (ref 0–53)
AST: 14 U/L (ref 0–37)
Albumin: 3.9 g/dL (ref 3.5–5.2)
Alkaline Phosphatase: 61 U/L (ref 39–117)
BUN: 17 mg/dL (ref 6–23)
CO2: 24 mEq/L (ref 19–32)
Calcium: 9 mg/dL (ref 8.4–10.5)
Chloride: 104 mEq/L (ref 96–112)
Creatinine, Ser: 1.31 mg/dL (ref 0.40–1.50)
Glucose, Bld: 84 mg/dL (ref 70–99)
Potassium: 4.9 mEq/L (ref 3.5–5.3)
Sodium: 139 mEq/L (ref 135–145)
Total Bilirubin: 0.6 mg/dL (ref 0.3–1.2)
Total Protein: 6.8 g/dL (ref 6.0–8.3)

## 2010-08-04 ENCOUNTER — Encounter: Payer: Self-pay | Admitting: Oncology

## 2010-08-07 LAB — CBC WITH DIFFERENTIAL/PLATELET
BASO%: 0.5 % (ref 0.0–2.0)
Basophils Absolute: 0 10*3/uL (ref 0.0–0.1)
EOS%: 0.3 % (ref 0.0–7.0)
HCT: 35.7 % — ABNORMAL LOW (ref 38.4–49.9)
HGB: 11.1 g/dL — ABNORMAL LOW (ref 13.0–17.1)
LYMPH%: 30.8 % (ref 14.0–49.0)
MCH: 29.8 pg (ref 27.2–33.4)
MCHC: 31.1 g/dL — ABNORMAL LOW (ref 32.0–36.0)
MCV: 96 fL (ref 79.3–98.0)
MONO%: 14.1 % — ABNORMAL HIGH (ref 0.0–14.0)
NEUT%: 54.3 % (ref 39.0–75.0)
lymph#: 1.9 10*3/uL (ref 0.9–3.3)

## 2010-08-13 NOTE — Letter (Signed)
Summary: [facility]  [facility]   Imported By: Sherian Rein 05/24/2010 09:35:59  _____________________________________________________________________  External Attachment:    Type:   Image     Comment:   External Document

## 2010-08-15 ENCOUNTER — Ambulatory Visit (HOSPITAL_BASED_OUTPATIENT_CLINIC_OR_DEPARTMENT_OTHER)
Admission: RE | Admit: 2010-08-15 | Discharge: 2010-08-15 | Disposition: A | Discharge status: Home / Self Care | Payer: Medicare Other | Attending: Urology | Admitting: Urology

## 2010-08-15 ENCOUNTER — Ambulatory Visit (HOSPITAL_COMMUNITY)
Admission: RE | Admit: 2010-08-15 | Discharge: 2010-08-15 | Disposition: A | Discharge status: Home / Self Care | Payer: Medicare Other | Source: Ambulatory Visit | Attending: Urology | Admitting: Urology

## 2010-08-15 ENCOUNTER — Other Ambulatory Visit: Payer: Self-pay | Admitting: Urology

## 2010-08-15 DIAGNOSIS — C3490 Malignant neoplasm of unspecified part of unspecified bronchus or lung: Secondary | ICD-10-CM | POA: Insufficient documentation

## 2010-08-15 DIAGNOSIS — Z01818 Encounter for other preprocedural examination: Secondary | ICD-10-CM | POA: Insufficient documentation

## 2010-08-15 DIAGNOSIS — I4891 Unspecified atrial fibrillation: Secondary | ICD-10-CM | POA: Insufficient documentation

## 2010-08-15 DIAGNOSIS — D473 Essential (hemorrhagic) thrombocythemia: Secondary | ICD-10-CM | POA: Insufficient documentation

## 2010-08-15 DIAGNOSIS — Z01812 Encounter for preprocedural laboratory examination: Secondary | ICD-10-CM | POA: Insufficient documentation

## 2010-08-15 DIAGNOSIS — C679 Malignant neoplasm of bladder, unspecified: Secondary | ICD-10-CM | POA: Insufficient documentation

## 2010-08-15 DIAGNOSIS — I10 Essential (primary) hypertension: Secondary | ICD-10-CM | POA: Insufficient documentation

## 2010-08-15 LAB — POCT I-STAT, CHEM 8
BUN: 22 mg/dL (ref 6–23)
Chloride: 102 mEq/L (ref 96–112)
Creatinine, Ser: 1.5 mg/dL (ref 0.4–1.5)
Potassium: 4.6 mEq/L (ref 3.5–5.1)
Sodium: 138 mEq/L (ref 135–145)

## 2010-08-27 ENCOUNTER — Other Ambulatory Visit: Payer: Self-pay | Admitting: Oncology

## 2010-08-27 ENCOUNTER — Encounter (HOSPITAL_BASED_OUTPATIENT_CLINIC_OR_DEPARTMENT_OTHER): Payer: Medicare Other | Admitting: Oncology

## 2010-08-27 DIAGNOSIS — R599 Enlarged lymph nodes, unspecified: Secondary | ICD-10-CM

## 2010-08-27 DIAGNOSIS — I10 Essential (primary) hypertension: Secondary | ICD-10-CM

## 2010-08-27 DIAGNOSIS — C3410 Malignant neoplasm of upper lobe, unspecified bronchus or lung: Secondary | ICD-10-CM

## 2010-08-27 DIAGNOSIS — I4891 Unspecified atrial fibrillation: Secondary | ICD-10-CM

## 2010-08-27 DIAGNOSIS — R188 Other ascites: Secondary | ICD-10-CM

## 2010-08-27 DIAGNOSIS — D649 Anemia, unspecified: Secondary | ICD-10-CM

## 2010-08-27 DIAGNOSIS — J90 Pleural effusion, not elsewhere classified: Secondary | ICD-10-CM

## 2010-08-27 LAB — CBC WITH DIFFERENTIAL/PLATELET
Basophils Absolute: 0 10*3/uL (ref 0.0–0.1)
Eosinophils Absolute: 0 10*3/uL (ref 0.0–0.5)
HCT: 34.4 % — ABNORMAL LOW (ref 38.4–49.9)
HGB: 11.1 g/dL — ABNORMAL LOW (ref 13.0–17.1)
LYMPH%: 21.7 % (ref 14.0–49.0)
MCV: 91.4 fL (ref 79.3–98.0)
MONO#: 0.8 10*3/uL (ref 0.1–0.9)
MONO%: 12.4 % (ref 0.0–14.0)
NEUT#: 4.3 10*3/uL (ref 1.5–6.5)
Platelets: 393 10*3/uL (ref 140–400)
WBC: 6.7 10*3/uL (ref 4.0–10.3)

## 2010-08-27 LAB — COMPREHENSIVE METABOLIC PANEL
Albumin: 3.9 g/dL (ref 3.5–5.2)
Alkaline Phosphatase: 60 U/L (ref 39–117)
BUN: 20 mg/dL (ref 6–23)
CO2: 27 mEq/L (ref 19–32)
Glucose, Bld: 100 mg/dL — ABNORMAL HIGH (ref 70–99)
Total Bilirubin: 0.9 mg/dL (ref 0.3–1.2)

## 2010-09-09 NOTE — Op Note (Signed)
  NAME:  FREEMON, BINFORD           ACCOUNT NO.:  [id number]  MEDICAL RECORD NO.:  [id number]           PATIENT TYPE:  O  LOCATION:  XRAY                         FACILITY:  [facility]  PHYSICIAN:  Ita Fritzsche I. 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  OPERATION:  Cystourethroscopy, transurethral biopsy of bladder tumors, and Gyrus vaporization of bladder tumors.  SURGEON:  Ereka Brau I. Patsi Sears, M.D.  ANESTHESIA:  General LMA.  PREPARATION:  After appropriate preanesthesia, the patient was brought to the operating room and placed on the operating table in the dorsal supine position where general LMA anesthesia was induced.  He was then replaced in the dorsal lithotomy position where the pubis was prepped with Betadine solution and draped in the usual fashion.  REVIEW OF HISTORY:  The patient is an 77 year old male with a history of recurrent superficial high-grade TCC bladder cancer, and a coincidental metastatic adenocarcinoma of the lung, responding to chemotherapy.  The patient has recently been found to have gross hematuria and recurrent bladder tumors and is now for biopsy and vaporization.  Note his history of chronic atrial fibrillation, hypertension and thrombocytosis as well.  PROCEDURE IN DETAIL:  Cystourethroscopy was accomplished and multiple bladder tumors were identified along the trigone and right and left lateral bladder walls.  These were biopsied and vaporized using the Gyrus vaporization instrumentation.  The biopsies were sent to laboratory.  Photo documentation was accomplished.  No bleeding was noted.  Xylocaine jelly was placed in the urethra after the bladder  was drained of fluid.  The patient was then awakened and taken to the recovery room in good condition.     Rishik Tubby I. Patsi Sears, M.D.     SIT/MEDQ  D:  08/15/2010  T:  08/15/2010  Job:  [id number]  Electronically Signed by Jethro Bolus M.D. on 09/09/2010 09:34:38 AM

## 2010-09-17 ENCOUNTER — Other Ambulatory Visit: Payer: Self-pay | Admitting: Oncology

## 2010-09-17 ENCOUNTER — Encounter (HOSPITAL_BASED_OUTPATIENT_CLINIC_OR_DEPARTMENT_OTHER): Payer: Medicare Other | Admitting: Oncology

## 2010-09-17 DIAGNOSIS — R188 Other ascites: Secondary | ICD-10-CM

## 2010-09-17 DIAGNOSIS — I10 Essential (primary) hypertension: Secondary | ICD-10-CM

## 2010-09-17 DIAGNOSIS — C3410 Malignant neoplasm of upper lobe, unspecified bronchus or lung: Secondary | ICD-10-CM

## 2010-09-17 DIAGNOSIS — J90 Pleural effusion, not elsewhere classified: Secondary | ICD-10-CM

## 2010-09-17 LAB — COMPREHENSIVE METABOLIC PANEL
ALT: <8 U/L (ref 0–53)
AST: 15 U/L (ref 0–37)
Alkaline Phosphatase: 61 U/L (ref 39–117)
BUN: 19 mg/dL (ref 6–23)
CO2: 25 mEq/L (ref 19–32)
Chloride: 101 mEq/L (ref 96–112)
Glucose, Bld: 106 mg/dL — ABNORMAL HIGH (ref 70–99)
Potassium: 4.9 mEq/L (ref 3.5–5.3)
Sodium: 137 mEq/L (ref 135–145)

## 2010-09-17 LAB — CBC WITH DIFFERENTIAL/PLATELET
BASO%: 0.6 % (ref 0.0–2.0)
LYMPH%: 29.4 % (ref 14.0–49.0)
MCHC: 32.7 g/dL (ref 32.0–36.0)
MONO#: 0.8 10*3/uL (ref 0.1–0.9)
MONO%: 13.7 % (ref 0.0–14.0)
NEUT#: 3.4 10*3/uL (ref 1.5–6.5)
Platelets: 312 10*3/uL (ref 140–400)
RBC: 3.88 10*6/uL — ABNORMAL LOW (ref 4.20–5.82)
RDW: 18.5 % — ABNORMAL HIGH (ref 11.0–14.6)
WBC: 6 10*3/uL (ref 4.0–10.3)

## 2010-09-25 LAB — CROSSMATCH: Antibody Screen: NEGATIVE

## 2010-09-25 LAB — ABO/RH: ABO/RH(D): O POS

## 2010-10-13 DEATH — deceased

## 2010-10-22 LAB — DIFFERENTIAL
Lymphocytes Relative: 19 % (ref 12–46)
Monocytes Absolute: 0.9 10*3/uL (ref 0.1–1.0)
Monocytes Relative: 10 % (ref 3–12)
Neutro Abs: 6.8 10*3/uL (ref 1.7–7.7)

## 2010-10-22 LAB — CBC
HCT: 47.9 % (ref 39.0–52.0)
Hemoglobin: 15.9 g/dL (ref 13.0–17.0)
Hemoglobin: 17.2 g/dL — ABNORMAL HIGH (ref 13.0–17.0)
MCHC: 33.1 g/dL (ref 30.0–36.0)
RBC: 5.66 MIL/uL (ref 4.22–5.81)
RDW: 15.2 % (ref 11.5–15.5)
WBC: 9.7 10*3/uL (ref 4.0–10.5)

## 2010-10-22 LAB — COMPREHENSIVE METABOLIC PANEL
Albumin: 3.8 g/dL (ref 3.5–5.2)
BUN: 17 mg/dL (ref 6–23)
Chloride: 102 mEq/L (ref 96–112)
Creatinine, Ser: 1.44 mg/dL (ref 0.4–1.5)
GFR calc non Af Amer: 47 mL/min — ABNORMAL LOW (ref >60)
Glucose, Bld: 105 mg/dL — ABNORMAL HIGH (ref 70–99)
Total Bilirubin: 1.4 mg/dL — ABNORMAL HIGH (ref 0.3–1.2)

## 2010-10-22 LAB — GLUCOSE, CAPILLARY: Glucose-Capillary: 117 mg/dL — ABNORMAL HIGH (ref 70–99)

## 2010-10-22 LAB — APTT: aPTT: 39 seconds — ABNORMAL HIGH (ref 24–37)

## 2010-10-23 LAB — POCT I-STAT 4, (NA,K, GLUC, HGB,HCT)
Glucose, Bld: 103 mg/dL — ABNORMAL HIGH (ref 70–99)
HCT: 55 % — ABNORMAL HIGH (ref 39.0–52.0)
Hemoglobin: 18.7 g/dL — ABNORMAL HIGH (ref 13.0–17.0)
Potassium: 4.5 mEq/L (ref 3.5–5.1)
Sodium: 137 mEq/L (ref 135–145)

## 2010-11-26 NOTE — Discharge Summary (Signed)
NAME:  [name], [name] NO.:  [id number]   MEDICAL RECORD NO.:  [id number]          PATIENT TYPE:  INP   LOCATION:  5012                         FACILITY:  [facility]   PHYSICIAN:  Bernette Redbird, M.D.   DATE OF BIRTH:  1926/08/21   DATE OF ADMISSION:  11/19/2007  DATE OF DISCHARGE:  11/23/2007                               DISCHARGE SUMMARY   [name] was admitted to [facility] on Nov 19, 2007, his  discharge date was Nov 23, 2007.   ADMIT DIAGNOSES:  1. Abdominal pain with elevated LFTs and elevated hemoglobin.  2. Recent colonoscopy with polypectomy.  3. Fever.  4. Pulmonary nodules that was followed by Dr. Coralyn Helling.  5. History of bladder cancer and hematuria that is followed by Dr.      Patsi Sears.   DISCHARGE DIAGNOSES:  1. Escherichia coli  bacteremia of uncertain etiology.  2. Locked cystic duct.  3. Large renal cysts.  4. Pulmonary nodules.  5. History of bladder cancer.   CONSULTS:  1. Infectious Disease.  2. Renal.  3. [facility].   PROCEDURES:  None.   RADIOLOGICAL EXAMS:  1. CT of the chest, abdomen, and pelvis done on Nov 19, 2007.  2. Ultrasound of the abdomen done on Nov 20, 2007.  3. Hepatobiliary scan done on Nov 22, 2007.   ASSESSMENT AND PLAN:  1. Abdominal pain.  The patient was admitted with right upper quadrant      pain.  He was admitted by gastroenterology as he had recently had a      colonoscopy on Nov 17, 2007, and began feeling ill the evening      following his colonoscopy.  On colonoscopy, he was found to have 2      polyps which were removed by cold snare.  He was also found to have      significant sigmoid diverticulosis.  The evening after his      colonoscopy, he developed symptoms including abdominal pain, gas,      and diaphoresis; however, the 2 nights prior to admission, he      developed hot and cold spells, was pretty much staying in bed, and      was found to have a temperature of  about 103 degrees.  On      admission, he was noted to have elevated liver chemistries      including a total bilirubin of 4.3, alkaline phosphatase of 128,      AST of 74, and ALT of 134.  During the course of his      hospitalization, he had a CT scan that showed massive bilateral      exophytic renal cysts.  Cystic change in the right kidney measured      26 cm and changes in the left kidney extends for more than 27 cm.      CT of the pelvis showed a focal area of circumferential wall      thickening with luminal narrowing of the transverse colon      diverticulosis and left inguinal hernia.  On Nov 20, 2007,  the      patient's right upper quadrant pain was beginning to resolve.  He      had an abdominal ultrasound that showed distended gallbladder with      sludge, no gallbladder wall thickening, no fluid, no extra or      intrahepatic biliary ductal dilatation, and no stones.  [facility] was called and the patient was seen by Dr. Harriette Bouillon.  A hepatobiliary scan was ordered and was positive for a      blocked cystic duct.  Dr. Baruch Merl saw the patient who was      much improved at that time.  He had no fever.  His white count was      trending down.  His LFTs were trending down.  He was eating well.      She found it unnecessary to perform surgery as he was doing so      well.  2. Sepsis, Gram-negative rods.  On admission, the patient was found to      have fevers of between 101 and 103.  He was admitted to the      hospital and started on Cipro and Flagyl.  Blood cultures revealed      Gram-negative rods which were E. Coli.  They were sensitive to      Cipro.  His white count rose to a high of 21.4 during this      hospitalization and gradually started trending down.  He was seen      by Infectious Disease who felt that the cause of the patient's      sepsis was undetermined.  It could be from his gallbladder.  It      could be from his renal cyst.   It could be from his urine which was      tested and found to only have 3-5 white blood cells in the sample      despite the hematuria.  Also source of infection could have been      his recent polypectomy.  They recommended that the patient be kept      on a 14-day course of Cipro and that he follow up with his      urologist to have his urine rechecked and his renal cyst further      evaluated.  3. Renal cyst.  The patient was seen by the Renal Service, Dr. Arlean Hopping      as his BUN and creatinine had climbed to a high of 43 and 2.03,      respectively.  Dr. Arlean Hopping noted that the patient did have acute      renal failure as well as proteinuria.  He mentions that the renal      cyst has been there for at least 3 years since 2003; however, they      have changed in size.  A 24-hour urine was ordered and found to      have 1665 urine protein and 14 free lambda light chains.  The      patient's urine was noted to have moderate bilirubin, large blood,      many bacteria, and many yeast.  Over the next several days, the      patient's azotemia improved and his BUN and creatinine decreased      with hydration and antibiotics.   At the time of discharge, the patient's labs were as follows:  White  blood count 14.5, hemoglobin 15.4, hematocrit 44.9, and platelets  220,000.  Sodium 136, potassium 3.4, BUN 29, creatinine 1.28, AST 69,  ALT 66, total protein 5.2, total bilirubin 1.7, and alk phos 106.   The patient's vital signs were as follows:  Temperature 98.1, pulse 82,  respirations 20, and blood pressure 112/81.  On the date of discharge,  the patient was alert and oriented in no apparent distress.  He stated  he was ready to go home.  His cardiovascular system had a regular rate  and rhythm.  His lungs were clear to auscultation anteriorly and  posteriorly.  He did have a slight nonproductive cough.  His abdomen was  distended, completely nontender, had quiet bowel sounds, felt fluid   stools.  There is no apparent organomegaly.  The patient was ambulating  with ease.  He was able to tolerate a solid diet well.  He was  discharged to home in stable condition with the following  recommendations that he follow up with Dr. Guerry Bruin, his primary  care physician, within 1 week to have his urine rechecked and labs  rechecked and that he follow up with Dr. Patsi Sears in 2 weeks after he  returns from his trip to [place] to follow up on his urine as well as  his renal status.  The patient was instructed to call the [facility] GI  office with any increase in abdominal pain or diarrhea over the next  week.   He was discharged on the following medications:  1. Hydrochlorothiazide one-half tablet daily.  2. Fish oil 300 mg three tabs daily.  3. Norvasc 10 mg daily.  4. Cipro 500 mg p.o. b.i.d. x12 days for which he was given a      prescription.      Stephani Police, PA    ______________________________  Bernette Redbird, M.D.    MLY/MEDQ  D:  11/23/2007  T:  11/24/2007  Job:  [id number]   cc:   Lynelle Smoke I. Patsi Sears, M.D.  Gaspar Garbe, M.D.  Danise Edge, M.D.  Bernette Redbird, M.D.  [name], M.D.  Coralyn Helling, MD  Maree Krabbe, M.D.

## 2010-11-26 NOTE — Consult Note (Signed)
[name], [name]           ACCOUNT NO.:  [id number]   MEDICAL RECORD NO.:  [id number]          PATIENT TYPE:  INP   LOCATION:  3312                         FACILITY:  [facility]   PHYSICIAN:  Maree Krabbe, M.D.DATE OF BIRTH:  1926-10-15   DATE OF CONSULTATION:  DATE OF DISCHARGE:                                 CONSULTATION   CHIEF COMPLAINT:  Acute renal failure.   HISTORY OF PRESENT ILLNESS:  This is a 76 year old male with a history  of hypertension and bladder cancer, admitted on Nov 19, 2007 with  abdominal pain and fevers to 103 deg. F, 3 days status post uneventful  colonoscopy with removal of two polyps.  He was also found to have a UTI  in ED.  Blood culture now positive for Gram-negative rods.  Creatinine  remained elevated despite IV fluid hydration.  WBC is increasing, he is  febrile and he is hypotensive.  He does take an ACE inhibitor regularly.  No NSAIDs.  No IV contrast during his hospital course.   PAST MEDICAL HISTORY:  1. Hypertension.  2. Bladder cancer, status post surgery in 2004 (Dr. Patsi Sears)  3. History of lung nodules (Dr. Craige Cotta)  4. UTI.  5. History of renal cyst.   HOME MEDICATIONS:  1. Lisinopril, dose unknown.  2. Hydrochlorothiazide, dose unknown.   ALLERGIES:  No known drug allergies.   SOCIAL HISTORY:  He lives with his wife in [place].  He is a retired  Medical illustrator.  He quit smoking several years ago.   FAMILY HISTORY:  No known kidney disease.   REVIEW OF SYSTEMS:  He endorses chills and fevers.  Denies chest pain or  shortness of breath.   PHYSICAL EXAMINATION:  VITAL SIGNS:  Temperature is 100.6, pulse is 89-  101, respiratory rate is 18-20, blood pressure is 82-119/60-82, oxygen  saturation is 90%-95% on room air.  GENERAL:  He is a pleasant, diaphoretic, and in no acute distress.  HEENT:  Dry mucous membranes.  CARDIOVASCULAR:  Regular rate and rhythm, 2/6 systolic ejection murmur.  LUNGS:  He has crackles at bases.  NECK:  He has no JVD.  GENITOURINARY:  He does endorse some hematuria.  ABDOMEN:  Soft, tender to palpation diffusely throughout very mild.  EXTREMITIES:  No edema, no rashes.  NEURO:  He is alert and oriented x3.  No asterixis.   LABORATORY DATA:  Abdominal ultrasound shows extending gallbladder with  sledge.  No wall thickening.  CT of the abdomen shows bilateral mass and  renal cyst draining from 12-20 cm.  Urine sodium less than 10, urine  creatinine is 160.6.  UA shows greater than 300 protein with moderate  bilirubin, 15 ketones, and many bacteria.  White count 21.4, hemoglobin  is 15.3, platelets are 341,000.  Sodium is 135, potassium is 3.5,  chloride is 100, bicarb is 21, BUN is 43, creatinine is 2.03, AST is 74,  ALT is 134.  Blood cultures shows Gram-negative for rods x2.  Urine  culture shows greater than 1000 colonies, multiple morphotypes.   ASSESSMENT AND PLAN:  This is a 77 year old white male with history of  hypertension, bladder cancer, now Gram-negative rod bacteremia, acute  renal failure, sepsis, increased LFTs and renal cyst per CT.  1. Sepsis secondary to Gram-negative rod bacteremia, unclear of      source, if it is urinary versus bowel, status post colonoscopy.      Per ID, on imipenem and ciprofloxacin.  Sensitivity is pending.      Per surgery, HIDA scan to monitor for the look at biliary system.  2. Acute renal failure, question if he is oliguric because only 200 cc      are recorded in the computer.  It is likely multifactorial, likely      FENa is less than 1 from decreased p.o. intake with the use of ACE      inhibitors.  He also have an intrinsic component with kidney cyst      along with ATNs and hypoperfusion secondary to shock.  IV fluids,      we will increase the IV fluids to 200 mL per hour.  We also need to      monitor strict I's and O's, it is currently only 200 cc recorded      yesterday.  We will place a Foley.  He has proteinuria, so we  will      check 24-hour urine to evaluate further.  If there is chronic      component, his renal failure may be a glomerulonephritis.  We will      continue to hold ACE inhibitors.  No indications of hemodialysis at      this time.  3. Transaminitis secondary to shock liver of biliary process; it is      trending down.  4. Renal cyst has been there for years at least since 2003.  Unsure if      they have change in size.  We will consult urology with his history      of bladder cancer.   Thank you very much for this consult.      [name], M.D.  Electronically Signed      Maree Krabbe, M.D.  Electronically Signed    TA/MEDQ  D:  11/21/2007  T:  11/22/2007  Job:  [id number]

## 2010-11-26 NOTE — Op Note (Signed)
NAME:  [name], [name]           ACCOUNT NO.:  [id number]   MEDICAL RECORD NO.:  [id number]          PATIENT TYPE:  AMB   LOCATION:  DAY                          FACILITY:  [facility]   PHYSICIAN:  Sigmund I. Patsi Sears, M.D.DATE OF BIRTH:  [birth year]   DATE OF PROCEDURE:  12/01/2008  DATE OF DISCHARGE:                               OPERATIVE REPORT   PREOPERATIVE DIAGNOSES:  Recurrent bladder cancer.   POSTOPERATIVE DIAGNOSES:  Recurrent bladder cancer.   OPERATION:  Cystourethroscopy, cold cup excisional biopsy of multiple  bladder tumors (1 cm, 3 cm, 1 cm equals 5 cm aggregate).   SURGEON:  Jethro Bolus, M.D.   ANESTHESIA:  General LMA.   PREPARATION:  After appropriate preanesthesia, the patient was brought  to the operating room, placed on the operating table in dorsal supine  position, where general LMA anesthesia was induced.  He was then  replaced in dorsal lithotomy position, where the pubis was prepped with  Betadine solution and draped in usual fashion.   REVIEW OF HISTORY:  Mr. Zinn is a 77 year old male with history of  recurrent low-grade bladder cancer, with complicated history of elevated  platelet count, clinical palpable lymph nodes, lung nodule,  hypertension, who now presents for TUR of recurrent bladder tumors.   PROCEDURE:  Cystourethroscopy reveals three distinct areas of bladder  tumor.  Two polypoid lesions were identified and the bladder neck.  These were removed with the biopsy forceps and sent as one block of  tissue.  A second 3-cm area of tumor is identified in the back wall of  the bladder.  This was cold-cup biopsied, fulgurated with the Bugbee  electrode.  In addition, another area is identified on the left bladder  wall, and this is also removed with the biopsy forceps, and sent to the  laboratory for examination.  The base is cauterized with the Bugbee  electrode.  Mitomycin is inserted into bladder, Foley catheter is placed  without trauma.  No bleeding was noted after cauterization.  Mitomycin  will be left in place for 20 minutes.  The Foley catheter will be left  overnight.      Sigmund I. Patsi Sears, M.D.  Electronically Signed     SIT/MEDQ  D:  12/01/2008  T:  12/01/2008  Job:  [id number]

## 2010-11-26 NOTE — Consult Note (Signed)
NAME:  [name], PLATNER           ACCOUNT NO.:  [id number]   MEDICAL RECORD NO.:  [id number]          PATIENT TYPE:  INP   LOCATION:  3312                         FACILITY:  [facility]   PHYSICIAN:  Maisie Fus A. [name], M.D.DATE OF BIRTH:  March 20, 1927   DATE OF CONSULTATION:  DATE OF DISCHARGE:                                 CONSULTATION   REQUESTING PHYSICIAN:  Bernette Redbird, MD   REASON FOR CONSULTATION:  Fever, right upper quadrant pain, elevated  white count, elevated liver function studies, and gallbladder sludge.   HISTORY OF PRESENT ILLNESS:  The patient is a pleasant 77 year old male,  who I was asked to see at the request of Dr. Molly Maduro Buccini due to  fever, chills, and elevation in his liver function studies and elevated  white count.  He was admitted on Nov 20, 2007 to Dr. Buccini's Service.  He underwent a colonoscopy about three days prior to that.  Two small  polyps were removed by cold snare.  He had significant sigmoid  diverticulosis.  He felt well after that, but the next day, in the same  way he became sick on his stomach and developed sweating.  His symptoms  progressed.  He was not eating very much and not taking liquids well.  He was complaining of some right-sided abdominal pain at that point in  time.  He was having cold spells, was sweaty.  He was brought to the  emergency room for abdominal pain on Nov 20, 2007.  He was noted to have  a temperature of 103.  At this point in time, he underwent a CT scan  because of concern for perforation, which did not show any significant  abnormality.  There is some stranding near the gallbladder with  diverticulosis without diverticulitis.  He has bilateral very large  renal cysts and a pulmonary nodule on CT.  This had been worked up.  He  also had a bump in his liver function studies.  He is admitted by  Gastroenterology.  I was asked to see him today at the request of Dr.  Matthias Hughs, given the fact that his white count was  elevated.  He was noted  by ultrasound to have some gallbladder sludge, but no stones.  His  gallbladder wall was not thickened.  There was no fluid around his  gallbladder.   PAST MEDICAL HISTORY:  1. Hypertension.  2. History of bladder cancer.  3. Bilateral large renal cysts.   PAST SURGICAL HISTORY:  Previous bladder procedures by Dr. Jethro Bolus.   FAMILY HISTORY:  Unclear, but otherwise negative.   SOCIAL HISTORY:  He is retired.  He is married.  Denies tobacco or  alcohol use.  He quit smoking 30 years ago.   ALLERGIES TO MEDICATIONS:  None listed.   REVIEW OF SYSTEMS:  Please see above, otherwise,  negative x15.   PHYSICAL EXAMINATION:  Blood pressure is about 100/50 and heart rate is  about 90.  GENERAL APPEARANCE:  A pleasant male sitting in bed, cooperative, and in  no distress.  HEENT:  Extraocular movements are intact.  Oropharynx clear.  No  jaundice.  CHEST:  Clear to auscultation.  Chest clear bilaterally.  Chest wall  motion normal.  CARDIOVASCULAR:  Regular rate and rhythm without rub, murmur, or gallop.  Abdomen is round and protuberant.  No rebound or guarding noted.  Negative Murphy's sign.  Large right abdominal mass noted.  No ascites.  EXTREMITIES:  Muscle tone is normal.  Range of motion, normal.  NEURO:  Glasgow Coma Scale was 15, otherwise normal.   LABORATORY STUDIES:  I have reviewed his laboratory results from today.  His CBC shows a white count of 21,000 with left shift.  Hemoglobin is  15.  Platelet count is 341,000.  CMP shows sodium 135, potassium 3.5,  chloride 100, CO2 21, glucose 165, BUN 43, and creatinine 2, just up  slightly.  Liver functions shows total bilirubin of 2.1, alk phos 91,  and SGOT and PT are normal.  His bilirubin is down from 4.1 two days  ago.  That was mostly indirect component; looking back on labs from Nov 19, 2007, which shows his indirect component to be 3.1 and direct being  1.2, alk phos at that point  was 128.  His prothrombin time is 16.6, it  is elevated slightly.  Cultures show gram-negative rods from blood  culture.  Urine has too numerous to count bacterial  species.   Ultrasound of the gallbladder shows no thickened gallbladder wall.  No  gallstones.  Sludge noted.  Common duct is normal in caliber without  dilatation.  He does have, by CT scan, multiple bilateral large cysts in  the kidneys, the largest being 20-cm involving his right kidney.   IMPRESSION:  1. Multiple renal cysts, questionable polycystic renal disease.  2. Gallbladder sludge without signs of cholecystitis or gallstones.  3. History of hypertension.  4. Questionable intermittent bacteremia.   DISCUSSION:  At this point in time, I have recommended a HIDA scan.  He  will probably need a urologist to see him, and it is possible that he  has seeded from his colonoscopy bacteria, and this could theoretically  seed these large renal cysts.  Urology may be helpful at this point to  help sort this out, but I do not see any major issues with his  gallbladder currently, and we will go ahead and check a HIDA to make  sure he does not have acalculous cholecystitis, which clinically he does  not seem to have.  We will follow up tomorrow with all this.      [name], M.D.  Electronically Signed     TAC/MEDQ  D:  11/21/2007  T:  11/22/2007  Job:  [id number]   cc:   Bernette Redbird, M.D.  Sigmund I. Patsi Sears, M.D.

## 2010-11-26 NOTE — H&P (Signed)
[name], [name]           ACCOUNT NO.:  [id number]   MEDICAL RECORD NO.:  [id number]          PATIENT TYPE:  INP   LOCATION:  5118                         FACILITY:  [facility]   PHYSICIAN:  Bernette Redbird, M.D.   DATE OF BIRTH:  1926/10/26   DATE OF ADMISSION:  11/19/2007  DATE OF DISCHARGE:                              HISTORY & PHYSICAL   This 77 year old gentleman is being admitted to the hospital because of  abdominal pain following a colonoscopy.   [name] underwent an uneventful colonoscopy by Dr. Danise Edge  3 days ago.  During that time, 2 small polyps were removed by cold  snare, and he was noted to have significant sigmoid diverticulosis.  The  colonoscopy was performed because of a prior history of colon polyps.   Immediately following the colonoscopy, he felt quite well, but by that  evening, after eating and egg salad dish with a friend at a restaurant,  he started to feel gaseous and developed sweating.  Over the ensuing  next couple of days, his symptoms gradually progressed, so he was not  eating much of anything, although he was taking liquids well, and there  was no problem with nausea or vomiting.  The 2 nights prior to  admission, he had hot and cold spells and was sweaty.  He was leading a  pretty much bed to chair existence.  Finally, by this afternoon, he was  in so much abdominal pain.  He could not even get dressed or sit up  without significant pain, and so at that point, it was decided to bring  him to the emergency room where, apparently, his temperature was noted  to be 103 degrees.   Because of concerns of possible perforation, lab work was obtained, and  he had plain abdominal films (negative) and an abdominal CT scan that  showed some stranding near the gallbladder, extensive diverticulosis  without diverticulitis, multiple very large renal cysts (previously  noted), some pulmonary nodules on chest CT (he has been worked up for  this  before), and a possible area of narrowing in the transverse colon.   He was also noted to have significant elevation of liver chemistries,  and the patient is not aware of having had such abnormalities in the  past.   Accordingly, inpatient care was felt to be warranted.   However, while in the emergency room, without receiving any pain  medicine as such apart from Tylenol, his abdominal pain has markedly  improved.   PAST MEDICAL HISTORY:  No known allergies.  Outpatient medications,  unknown.  He takes a blood pressure pill and a diuretic, but does not  recall the names.  Operations, none.  Chronic medical illnesses,  hypertension, history of bladder cancer status post intra-bladder  cautery.  No prior history of coronary disease, hypertension, COPD,  significant hypercholesterolemia, or other medical problems.  Habits,  the patient stopped smoking about 30 years ago and has only occasional  ethanol.   FAMILY HISTORY:  Details are unclear, but no clear family history of  intracolonic malignancies.   SOCIAL HISTORY:  The patient is retired.  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   REVIEW OF SYSTEMS:  Negative for ongoing or prodromal GI tract  symptomatology such as anorexia, involuntary weight loss, abdominal  pain, constipation, diarrhea, or rectal bleeding.   PHYSICAL EXAMINATION:  VITAL SIGNS:  Blood pressure is about 96/50 and  heart rate is approximately 90.  HEENT:  Anicteric.  No obvious pallor.  CHEST:  Clear to auscultation, a couple of minimal rales at the right  base, which do not clear postoperatively.  HEART:  Normal.  No gallops, rubs, murmurs, clicks, or arrhythmias  appreciated.  ABDOMEN:  Substantially rotund and protuberant, although apparently this  is normal for the patient.  Bowel sounds are normal, no bruits.  No  organomegaly appreciated.  No obvious ascites.  There is no significant  tenderness to rather deep  palpation.  RECTAL:  Exam not performed.  Extremities: No tibial edema.   LABORATORY DATA:  White count 10,500 with 92 polys and 6 lymphs.  Hemoglobin elevated at 20.1 with a hematocrit of 59, platelets 446,000,  INR normal at 1.3.  Chemistry panel pertinent for normal lipase, but  moderate elevation of liver chemistries with bilirubin 4.3 (1.2,  direct), alk phos 128, which is just minimally elevated, AST 74, and ALT  134.  BUN is slightly high at 30 and creatinine is moderately high at  1.8.   IMPRESSION:  1. Abdominal pain, gradually increasing, nonspecific in character,      rather diffuse.  It has subsided since coming to the emergency      room.  2. 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  3. Status post recent polyp removal.  4. Elevation of hemoglobin.  5. Radiographic abnormality of the lungs, for which he is followed as      an outpatient by Dr. Craige Cotta.  6. History of bladder cancer with some hematuria tonight.  7. History of colon polyps and significant diverticulosis.   PLAN:  1. IV antibiotics.  2. IV fluids.  3. Follow labs.  4. If persistent elevation of hemoglobin is noted, the patient may      need a sleep study to make sure that he is not having sleep apnea      as a trigger for erythropoiesis.  5. Follow labs as well as exam.   In the meantime, we will cover the patient with Rocephin empirically,  and obtain an abdominal ultrasound to look for evidence of gallstones  and/or biliary ductal dilatation.           ______________________________  Bernette Redbird, M.D.     RB/MEDQ  D:  11/20/2007  T:  11/20/2007  Job:  [id number]   cc:   Coralyn Helling, MD  Danise Edge, M.D.  Gaspar Garbe, M.D.  Sigmund I. Patsi Sears, M.D.

## 2010-11-26 NOTE — Discharge Summary (Signed)
[name], [name]           ACCOUNT NO.:  [id number]   MEDICAL RECORD NO.:  [id number]          PATIENT TYPE:  INP   LOCATION:  5012                         FACILITY:  [facility]   PHYSICIAN:  Bernette Redbird, M.D.   DATE OF BIRTH:  21-Oct-1926   DATE OF ADMISSION:  11/19/2007  DATE OF DISCHARGE:  11/23/2007                               DISCHARGE SUMMARY   ADMIT DIAGNOSES:  1. Abdominal pain.  2. Fever.  3. Elevated liver chemistries.  4. Status post recent polypectomy.  5. Radiographic abnormality of the lungs.  6. History of bladder cancer.  7. Hypertension.   DISCHARGE DIAGNOSES:  1. Escherichia coli bacteremia.  2. Blocked cystic duct.  3.    DICTATION ENDED AT THIS POINT.      Stephani Police, PA    ______________________________  Bernette Redbird, M.D.    MLY/MEDQ  D:  11/23/2007  T:  11/24/2007  Job:  [id number]

## 2010-11-29 NOTE — Assessment & Plan Note (Signed)
[facility]                               PULMONARY OFFICE NOTE   NAME:[name] [name] WILLMANN                  MRN:          [id number]  DATE:05/06/2006                            DOB:          27-Mar-1927    REFERRING PHYSICIAN:  Gaspar Garbe, M.D.   I saw [name] [name] today for evaluation of a lung nodule.  He had a chest  x-ray as part of his routine medical evaluation.  This was done on March 24, 2006, and was compared to x-rays from March 18, 2005 as well as  March 26, 2004, and there appeared to be a density in the right mid lung  zone measuring approximately 1.1 cm.  As a result of this, [name] [name]  was referred to have a CT scan of his chest with intravenous contrast, which  was done on April 20, 2006, and this showed a nodular infiltrate in the  superior segment of the right lower lobe, measuring approximately 1 cm, then  there was an area of alveolar consolidation about 2 to 3 cm in the lingular  segment, there is a tiny calcified pleural-based nodule in the right  posterior costophrenic sulcus and a 4 to 5 mm subpleural nodule in the  lateral basal segment of the left lower lobe, as well as a 3 to 4 mm nodule  in the right middle lobe along the major fissure, and then there were  several small calcified lymph nodes in the mediastinum and right hilum.  Unfortunately, I do not have the actual films for my review at this time,  but I do have the above-stated reports.  With regards to [name] [name]'s  symptoms, he essentially has none.  Specifically, he denies any headaches,  dizziness, blurred vision, sinus congestion, difficulty swallowing,  hoarseness, chest pains, palpitations, coughing, wheezing, sputum  production, hemoptysis, fevers, chills, sweats, weight loss, abdominal pain,  nausea, vomiting, diarrhea, skin rashes, joint pains, or leg swelling.  He  had traveled to [facility] approximately 3 weeks ago, but  has not done any  other traveling recently.  He has not had any exposure to any farm animals  or birds.  He has not had any tick exposures.  There is no history of  tuberculosis or any other underlying lung disease.  He quit smoking  approximately 30 years ago.  He started in his 12s and smoked approximately  a half a pack of cigarettes per day.  He has been treated by Dr. 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  [name] [name] denies  any significant occupational exposures.   PAST MEDICAL HISTORY:  Significant for hypertension as well as the  transitional cell carcinoma.   CURRENT MEDICATIONS:  Hydrochlorothiazide, Norvasc, glucosamine, and fish  oil.   ALLERGIES:  He has no known drug allergies.   SOCIAL HISTORY:  He is a retired Medical illustrator.  He moved from [place], [place] in  1961.  He quit smoking approximately 30 years ago.  He has occasional  alcohol use.  FAMILY HISTORY:  Noncontributory.   REVIEW OF SYSTEMS:  Essentially negative except for as stated above.   PHYSICAL EXAM:  He is 6 feet 2 inches tall.  Weight is 232 pounds.  Temperature 97.8.  Blood pressure is 142/78.  Heart rate is 83.  Oxygen  saturation is 95% on room air.  HEENT:  Pupils reactive.  Extraocular muscles are intact.  There is no sinus  tenderness.  No nasal discharge.  No oral lesions.  No lymphadenopathy.  No  thyromegaly.  HEART:  S1, S2 regular rhythm.  CHEST:  Clear to auscultation.  ABDOMEN:  Soft, nontender, positive bowel sounds.  EXTREMITIES:  There is no edema, cyanosis, or clubbing.  NEUROLOGIC:  He was alert and oriented x3 and no focal deficits were  appreciated.   IMPRESSION:  Multiple pulmonary nodules, the one being of most concern is  the one located in the superior segment of the right lower lobe.  What I  have asked [name] [name] to do is to obtain extra copies of the films for  his  chest x-ray and CT scan for my review.  In the meantime, I will  tentatively make arrangements for him to have a repeat CT scan of his chest  with intravenous contrast in approximately 1 month for comparison.  If there  is any significant change in the appearance of these nodules, then I would  recommend proceeding with a PET scan and then electively would need to have  him undergo tissue diagnosis, and the route of this would be determined  after review of his repeat CT scan of the chest.  If there is no significant  change within a month on his imaging studies, then I would recommend  continued radiologic followup.       Coralyn Helling, MD      VS/MedQ  DD:  05/06/2006  DT:  05/07/2006  Job #:  [id number]   cc:   Gaspar Garbe, M.D.

## 2010-11-29 NOTE — Assessment & Plan Note (Signed)
[facility]                             PULMONARY OFFICE NOTE   NAME:[name], [name]                  MRN:          [id number]  DATE:09/15/2006                            DOB:          24-Jan-1927    I received the copy of [name] followup CT scan of the chest  with contrast and it again showed the soft tissue densities in the right  lower lobe which were essentially unchanged, and unchanged interstitial  densities in the left upper lobe.  There was also noted to be a cystic  mass in the left kidney which may need to have further evaluation with  an abdominal CT.  At this point I have recommended that he have repeat  CT scan of his chest with contrast in approximately 6 months to further  document stability of his pulmonary lesions and I would defer to his  primary care physician for further evaluation of the cystic mass in his  left kidney.     Coralyn Helling, MD  Electronically Signed    VS/MedQ  DD: 09/23/2006  DT: 09/25/2006  Job #: [id number]   cc:   Gaspar Garbe, M.D.

## 2010-11-29 NOTE — Assessment & Plan Note (Signed)
[facility]                             PULMONARY OFFICE NOTE   NAME:Garate, CAMDYN BESKE                  MRN:          [id number]  DATE:09/15/2006                            DOB:          13-Oct-1926    I saw Mr. Alkins today for evaluation of his abnormal CT scan of the  chest.   Since his last visit, he has essentially remained symptom free, and he  has not had any significant change in his health or his medications.   PHYSICAL EXAMINATION:  He is 230 pounds.  Temperature is 97.8.  Blood  pressure is 138/80.  Heart rate is 85.  Oxygen saturation is 96%.  HEENT:  Pupils are reactive.  He has no sinus tenderness.  No oral  lesions.  No lymphadenopathy.  HEART:  With S1 and S2.  CHEST:  Clear to auscultation.  ABDOMEN:  Soft and non-tender.  EXTREMITIES:  There is no edema.   IMPRESSION:  Abnormal CT scan of the chest with multiple nodular  densities in the right lower lobe.  I will have him undergo a repeat CT  scan of his chest with contrast to document stability, and if this  unchanged, then I would have him undergo a repeat CT scan of his chest  in another 6 months.  Otherwise, if there are any changes, then further  interventions may be necessary.     Coralyn Helling, MD  Electronically Signed    VS/MedQ  DD: 09/15/2006  DT: 09/15/2006  Job #: [id number]   cc:   Gaspar Garbe, M.D.

## 2010-11-29 NOTE — Op Note (Signed)
[facility]  Patient:    [name], [name]                    MRN: [id number] Proc. Date: 08/03/00 Adm. Date:  [id number] Attending:  Laqueta Jean                           Operative Report  PREOPERATIVE DIAGNOSIS:  Recurrent bladder tumor.  POSTOPERATIVE DIAGNOSIS:  Recurrent bladder tumor.  OPERATION: Cystourethroscopy and multiple bladder tumor cold cup biopsies and cauterization of bladder tumors.  SURGEON:  Sigmund I. Patsi Sears, M.D.  ANESTHESIA:  General (LMA).  PROCEDURE PREPARATION:  After appropriate preanesthesia, the patient was brought to the operating room and placed on the operating table in the dorsal supine position where general LMA anesthesia was introduced.  He was then replaced in the dorsolithotomy position with the pubis prepped with Betadine solution and draped in the usual fashion.  DESCRIPTION OF PROCEDURE:  Cystourethroscopy was accomplished, and multiple recurrent bladder tumors were identified. These were cold cup bladder biopsied, and the areas were cauterized.  Following this, the resectoscope was removed.  The patient was awakened and taken to the recovery room in good condition.  He was given a B & O suppository prior to awakening.  He was also given IV Toradol and Zofran.  The patient was awakened in good condition. DD:  08/03/00 TD:  08/03/00 Job: [id number]

## 2010-11-29 NOTE — Op Note (Signed)
   NAME:  [name], [name]                     ACCOUNT NO.:  [id number]   MEDICAL RECORD NO.:  [id number]                   PATIENT TYPE:  AMB   LOCATION:  ENDO                                 FACILITY:  [facility]   PHYSICIAN:  Danise Edge, M.D.                DATE OF BIRTH:  May 09, 1927   DATE OF PROCEDURE:  09/21/2002  DATE OF DISCHARGE:                                 OPERATIVE REPORT   REFERRING PHYSICIAN:  Gaspar Garbe, M.D.   PROCEDURE:  Colonoscopy with sigmoid polypectomy.   PROCEDURE INDICATION:  Mr. [name] is a 77 year old male, born  14-Mar-1927.  Mr. [name] has undergone multiple colonoscopic exams  to remove neoplastic but noncancerous polyps.  He is scheduled to undergo a  screening colonoscopy with polypectomy to prevent colon cancer.   ENDOSCOPIST:  Charolett Bumpers, M.D.   PREMEDICATION:  Versed 7.5 mg, Demerol 50 mg.   ENDOSCOPE:  Olympus adult colonoscope.   DESCRIPTION OF PROCEDURE:  After obtaining informed consent, Mr. [name]  was placed in the left lateral decubitus position.  I administered  intravenous Demerol and intravenous Versed to achieve conscious sedation for  the procedure.  The patient's blood pressure, oxygen saturation, and cardiac  rhythm were monitored throughout the procedure and documented in the medical  record.   Anal inspection was normal.  Digital rectal examination was normal.  The  prostate was nonnodular.  The Olympus adult colonoscope was introduced into  the rectum and advanced to the cecum.  Colonic preparation for the exam  today was satisfactory.   RECTUM:  Normal.  SIGMOID COLON AND DESCENDING COLON:  Extensive left colonic diverticulosis.  At 20 cm from the anal verge, a 2 mm sessile polyp was removed with the  electrocautery snare.  SPLENIC FLEXURE:  Normal.  TRANSVERSE COLON:  Normal.  HEPATIC FLEXURE:  Normal.  ASCENDING COLON:  Normal.  CECUM AND ILEOCECAL VALVE:  Normal.    ASSESSMENT:  1. Left colonic diverticulosis.  2. At 20 cm from the anal verge, from the distal sigmoid colon, a 2 mm     sessile polyp was removed.   RECOMMENDATIONS:  Repeat colonoscopy in five years.                                               Danise Edge, M.D.    MJ/MEDQ  D:  09/21/2002  T:  09/21/2002  Job:  [id number]   cc:   Gaspar Garbe, M.D.  [address]  [place]  [place] [address]  Fax: [phone]

## 2010-11-29 NOTE — Procedures (Signed)
[facility]  Patient:    [name], [name]                    MRN: [id number] Proc. Date: 01/06/00 Adm. Date:  [id number] Attending:  Laqueta Jean                           Procedure Report  PREOPERATIVE DIAGNOSIS:  Recurrent right lateral bladder wall bladder tumor.  POSTOPERATIVE DIAGNOSIS:  Recurrent right lateral bladder wall bladder tumor.  OPERATION:  Cystourethroscopy, cold cup bladder biopsies of bladder tumor and cauterization of bladder tumor site.  SURGEON:  Sigmund I. Patsi Sears, M.D.  ANESTHESIA:  General (LMA).  PREPARATION:  After appropriate anesthesia, patient was brought to the operating room and placed on the operating table in the dorsal supine position where general anesthesia was introduced.  He was then replaced in the dorsal lithotomy position where the pubis was prepped with Betadine solution and draped in the usual fashion.  DESCRIPTION OF PROCEDURE:  Cystourethroscopy was accomplished and shows a bladder tumor in the right lateral wall, between the lateral wall and the bladder dome.  Using suprapubic pressure, the bladder tumor was better identified and cold cup bladder biopsies were taken of tumor; cauterization of the entire tumor and of the biopsy sites were accomplished.  Following this, no further tumors were identified and the bladder was drained of fluid and Xylocaine jelly was left in the urethra x 2.  The patient was awakened and taken to the recovery room, after giving IV Toradol. DD:  01/06/00 TD:  01/07/00 Job: [id number]

## 2010-11-29 NOTE — Assessment & Plan Note (Signed)
[facility]                             PULMONARY OFFICE NOTE   NAME:[name], [name] HUEGEL                  MRN:          [id number]  DATE:06/10/2206                            DOB:          20-Dec-1926    I saw [name] today in followup after he had undergone his CT  scan of the chest from May 18, 2006.   I have had an opportunity to review the films from this and compared it  to his films from April 20, 2006, and this showed an essentially stable  appearance of his lesions.  The one which would be of most concern would  be the nodular density in the right lower lobe, but again this has not  changed since previous CT scan of the chest.  After much discussion with  [name], he really is not having much as far as symptoms at all.   I reviewed several options with him to further evaluate this.  These  included proceeding with bronchoscopy and biopsy, although this would be  of very low yield.  An alternative would be to have him referred to  interventional radiology for a transthoracic fine-needle aspiration.  He  was quite reluctant to do this at the present time.  The other option  would be to refer him to thoracic surgery for video-assisted  thoracoscopy and excision of the lesion, which again he is quite  reluctant to do.  I then also discussed the option of undergoing a PET  scan to see if he had increased activity in this area and then reassess  the situation.  The other alternative would be to have him repeat the CT  scan in approximately 3-4 months to see if there was any change in the  appearance of the lesion.   After considering all these options, [name] has decided he would  prefer to wait for 3 or 4 months and repeat a CT scan at that time to  see if there are, in fact, any changes.     Coralyn Helling, MD  Electronically Signed    VS/MedQ  DD: 06/11/2006  DT: 06/12/2006  Job #: [id number]   cc:   Gaspar Garbe, M.D.

## 2011-03-19 ENCOUNTER — Ambulatory Visit: Payer: Medicare Other | Admitting: Cardiology

## 2014-10-10 ENCOUNTER — Telehealth: Payer: Self-pay

## 2014-10-10 NOTE — Telephone Encounter (Signed)
10/10/14 3 Disc Received from [facility] and filed on shelf.Britt Bottom
